# Patient Record
Sex: Male | Born: 1955 | Race: White | Hispanic: No | Marital: Married | Smoking: Former smoker
Health system: Southern US, Community
[De-identification: ages and names within clinical notes are randomized; demographics above are authoritative.]

## PROBLEM LIST (undated history)

## (undated) DIAGNOSIS — E291 Testicular hypofunction: Secondary | ICD-10-CM

## (undated) DIAGNOSIS — E039 Hypothyroidism, unspecified: Secondary | ICD-10-CM

## (undated) DIAGNOSIS — I10 Essential (primary) hypertension: Secondary | ICD-10-CM

## (undated) DIAGNOSIS — E785 Hyperlipidemia, unspecified: Secondary | ICD-10-CM

## (undated) DIAGNOSIS — E559 Vitamin D deficiency, unspecified: Secondary | ICD-10-CM

## (undated) DIAGNOSIS — M109 Gout, unspecified: Secondary | ICD-10-CM

## (undated) HISTORY — DX: Vitamin D deficiency, unspecified: E55.9

## (undated) HISTORY — DX: Essential (primary) hypertension: I10

## (undated) HISTORY — DX: Testicular hypofunction: E29.1

## (undated) HISTORY — DX: Gout, unspecified: M10.9

## (undated) HISTORY — DX: Hyperlipidemia, unspecified: E78.5

## (undated) HISTORY — DX: Hypothyroidism, unspecified: E03.9

---

## 1993-06-18 DIAGNOSIS — I10 Essential (primary) hypertension: Secondary | ICD-10-CM

## 1993-06-18 HISTORY — DX: Essential (primary) hypertension: I10

## 1997-10-08 ENCOUNTER — Ambulatory Visit (HOSPITAL_COMMUNITY): Admission: RE | Admit: 1997-10-08 | Discharge: 1997-10-08 | Payer: Self-pay | Admitting: Cardiology

## 1999-05-22 ENCOUNTER — Encounter: Payer: Self-pay | Admitting: Orthopedic Surgery

## 1999-05-22 ENCOUNTER — Encounter: Admission: RE | Admit: 1999-05-22 | Discharge: 1999-05-22 | Payer: Self-pay | Admitting: Orthopedic Surgery

## 2000-12-27 ENCOUNTER — Ambulatory Visit (HOSPITAL_COMMUNITY): Admission: RE | Admit: 2000-12-27 | Discharge: 2000-12-27 | Payer: Self-pay | Admitting: Family Medicine

## 2000-12-27 ENCOUNTER — Encounter: Payer: Self-pay | Admitting: Family Medicine

## 2003-12-10 ENCOUNTER — Ambulatory Visit (HOSPITAL_COMMUNITY): Admission: RE | Admit: 2003-12-10 | Discharge: 2003-12-10 | Payer: Self-pay | Admitting: Cardiology

## 2007-02-21 ENCOUNTER — Ambulatory Visit: Payer: Self-pay | Admitting: Internal Medicine

## 2007-02-25 ENCOUNTER — Ambulatory Visit: Payer: Self-pay | Admitting: Cardiology

## 2007-03-07 ENCOUNTER — Ambulatory Visit: Payer: Self-pay | Admitting: Pulmonary Disease

## 2007-03-11 ENCOUNTER — Ambulatory Visit: Payer: Self-pay | Admitting: Internal Medicine

## 2007-03-11 LAB — CONVERTED CEMR LAB
Albumin: 4.1 g/dL (ref 3.5–5.2)
BUN: 13 mg/dL (ref 6–23)
Basophils Relative: 3.8 % — ABNORMAL HIGH (ref 0.0–1.0)
CO2: 30 meq/L (ref 19–32)
Calcium: 9.5 mg/dL (ref 8.4–10.5)
Creatinine, Ser: 1.1 mg/dL (ref 0.4–1.5)
Eosinophils Relative: 0.2 % (ref 0.0–5.0)
GFR calc non Af Amer: 74 mL/min
Hemoglobin: 15.9 g/dL (ref 13.0–17.0)
Lymphocytes Relative: 8.9 % — ABNORMAL LOW (ref 12.0–46.0)
MCV: 85.7 fL (ref 78.0–100.0)
Monocytes Absolute: 0.7 10*3/uL (ref 0.2–0.7)
Monocytes Relative: 6.7 % (ref 3.0–11.0)
Neutro Abs: 8.4 10*3/uL — ABNORMAL HIGH (ref 1.4–7.7)

## 2007-03-13 ENCOUNTER — Ambulatory Visit: Admission: RE | Admit: 2007-03-13 | Discharge: 2007-03-13 | Payer: Self-pay | Admitting: Internal Medicine

## 2007-03-13 ENCOUNTER — Encounter: Payer: Self-pay | Admitting: Internal Medicine

## 2007-04-11 ENCOUNTER — Ambulatory Visit: Payer: Self-pay | Admitting: Cardiology

## 2008-06-18 DIAGNOSIS — E785 Hyperlipidemia, unspecified: Secondary | ICD-10-CM

## 2008-06-18 HISTORY — DX: Hyperlipidemia, unspecified: E78.5

## 2008-06-18 HISTORY — PX: BRONCHIAL BIOPSY: SHX5109

## 2008-06-30 ENCOUNTER — Encounter (INDEPENDENT_AMBULATORY_CARE_PROVIDER_SITE_OTHER): Payer: Self-pay | Admitting: *Deleted

## 2008-07-09 ENCOUNTER — Ambulatory Visit: Payer: Self-pay | Admitting: Internal Medicine

## 2008-07-09 DIAGNOSIS — E782 Mixed hyperlipidemia: Secondary | ICD-10-CM

## 2008-07-09 DIAGNOSIS — D869 Sarcoidosis, unspecified: Secondary | ICD-10-CM

## 2008-07-09 DIAGNOSIS — I10 Essential (primary) hypertension: Secondary | ICD-10-CM | POA: Insufficient documentation

## 2008-07-13 ENCOUNTER — Telehealth (INDEPENDENT_AMBULATORY_CARE_PROVIDER_SITE_OTHER): Payer: Self-pay | Admitting: *Deleted

## 2009-01-12 IMAGING — CR DG CHEST 2V
2 series · 2 of 2 positions shown · non-contrast
Comparison: None

CLINICAL DATA: Cough

CHEST - 2 VIEW:

[view not recorded (1 of 2)]
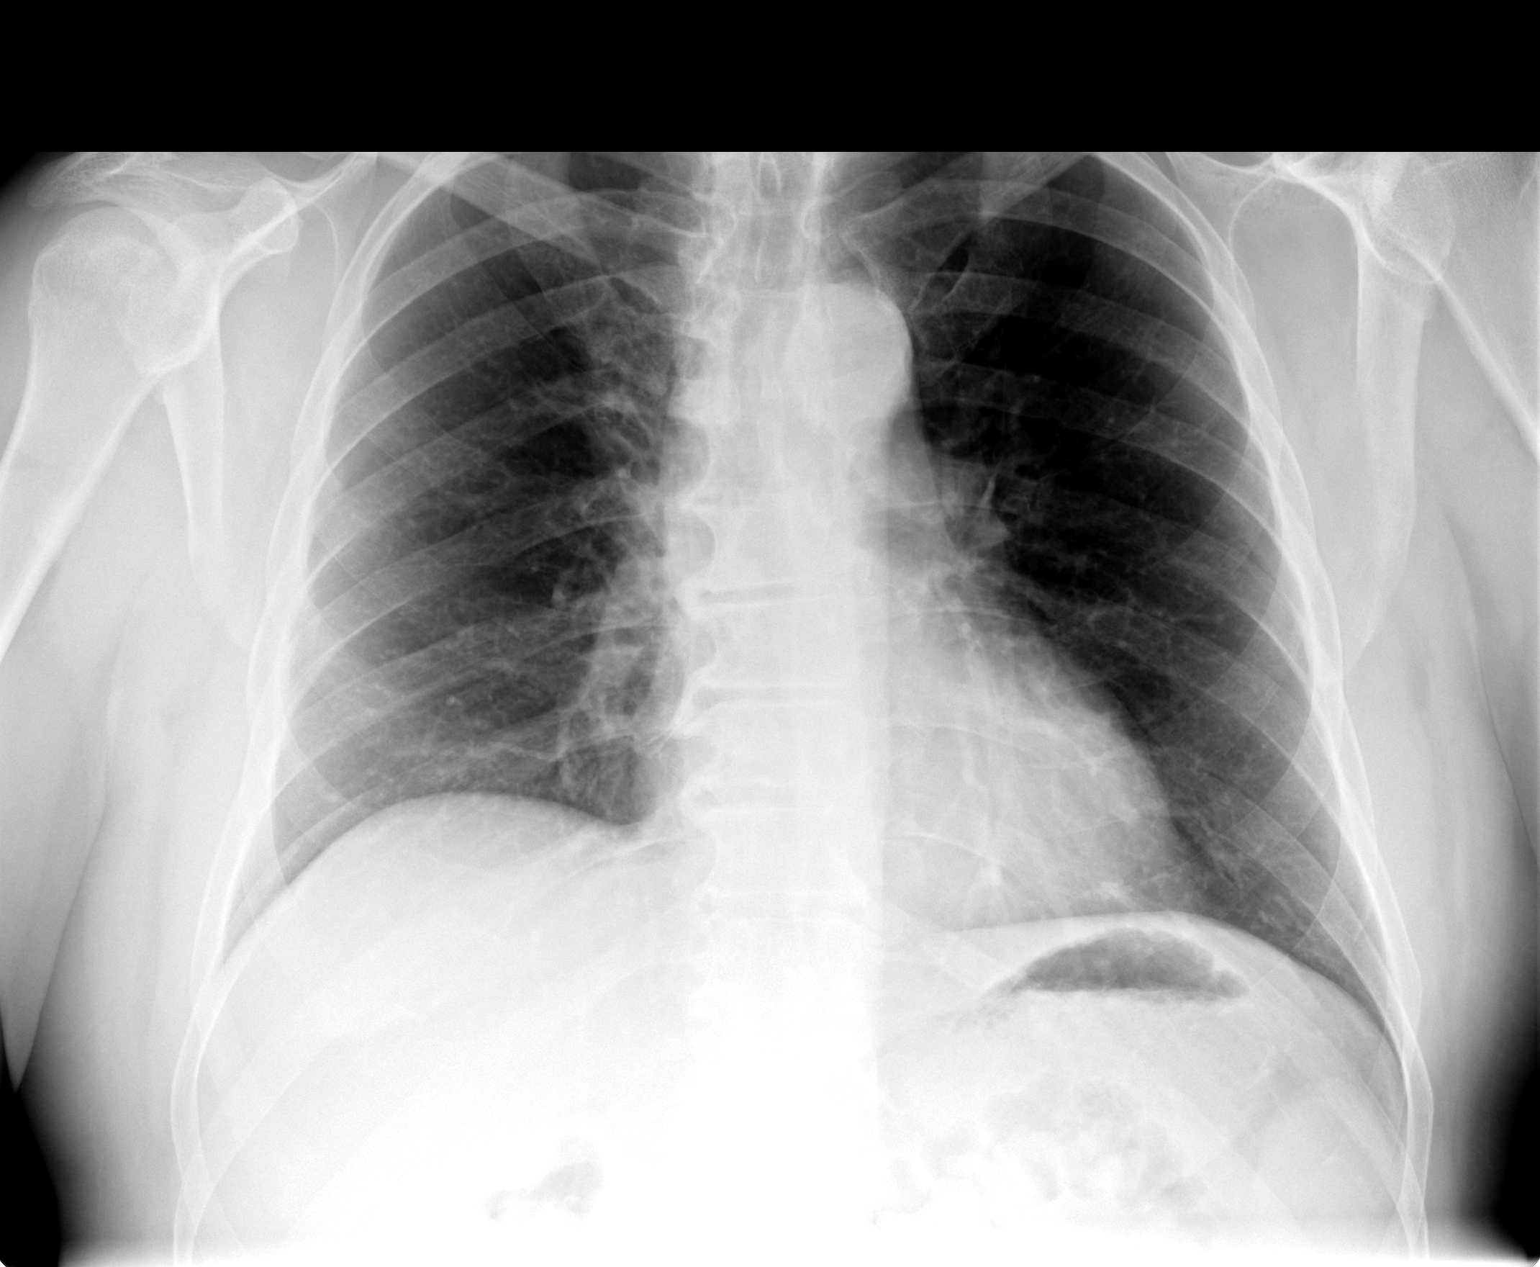

[view not recorded (2 of 2)]
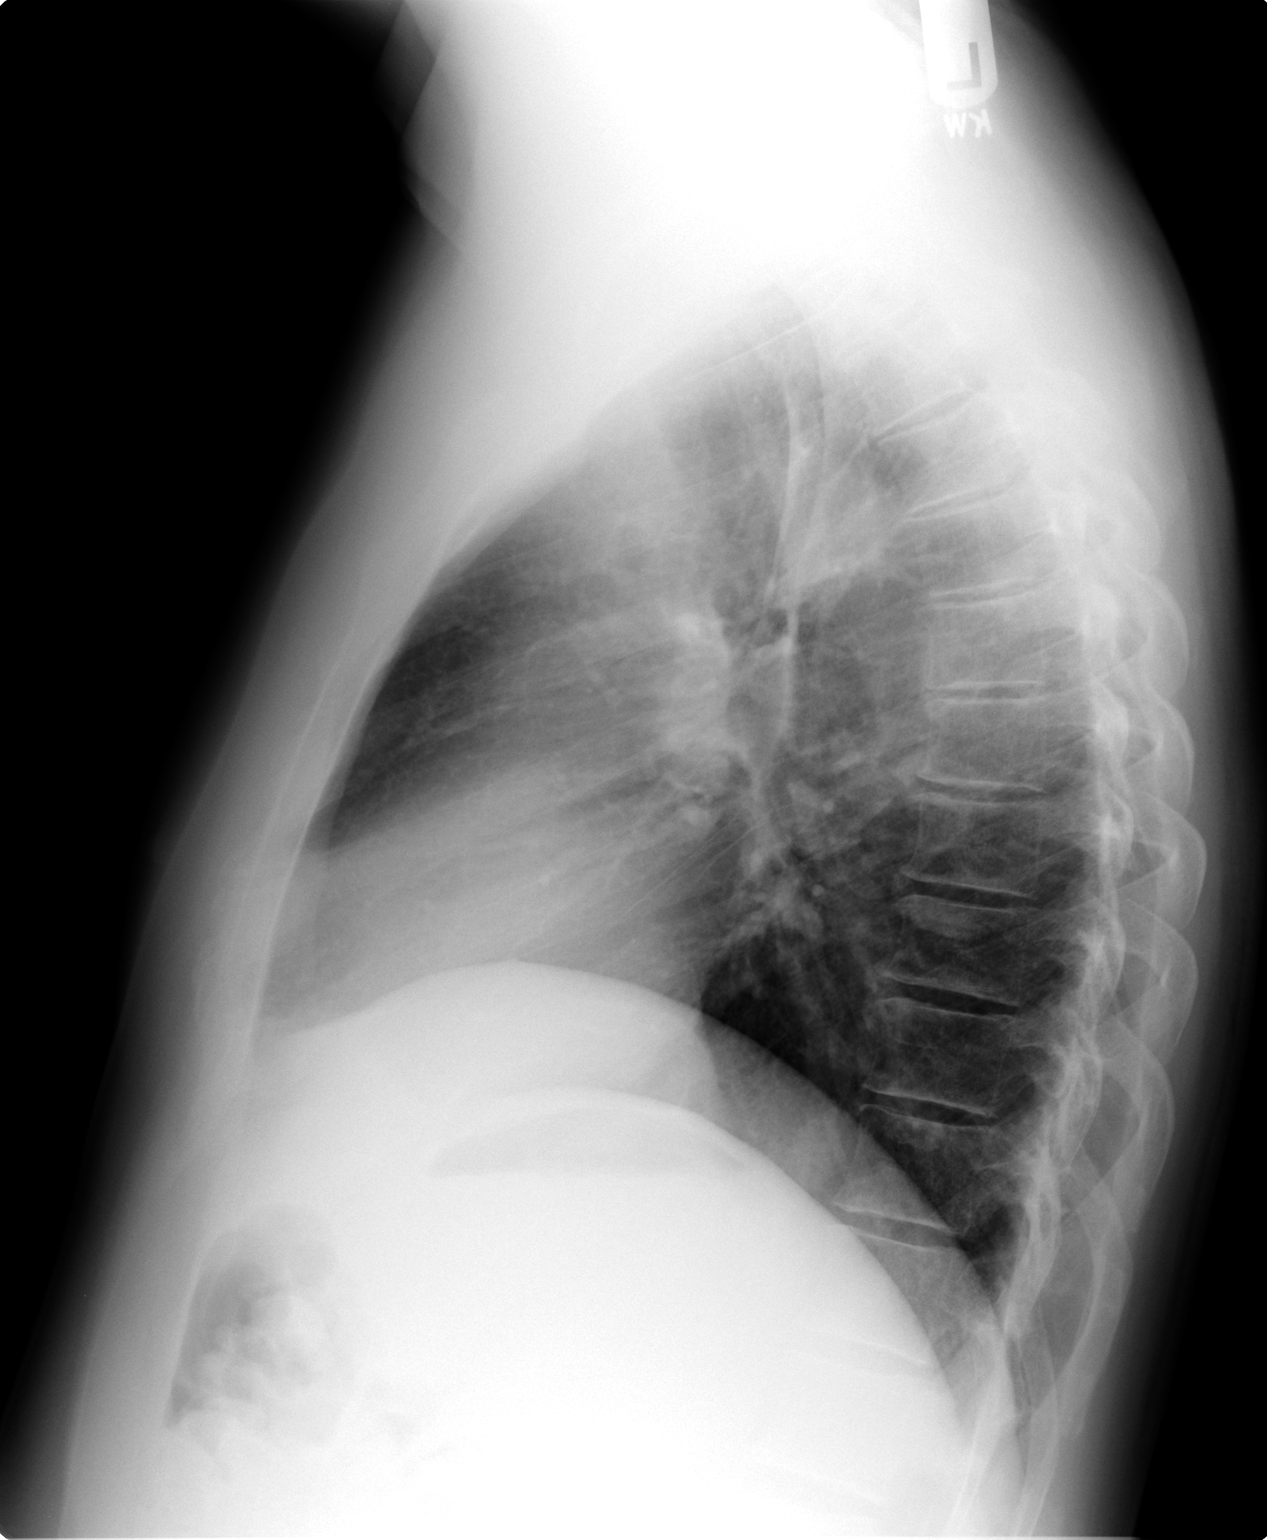

[2 of 2 positions shown; findings below may reference images not displayed]

FINDINGS: On the lateral view, there is triangular increased density noted
projecting of the upper lobes. This may correspond to slight increased density
in the right suprahilar region. Question area of pneumonia. Mild peribronchial
thickening. No focal opacities in the lower lungs. No effusions. Heart is normal
size.
IMPRESSION: Question consolidation in the right suprahilar region, best seen on the lateral
view. Recommend followup to resolution to exclude underlying lesion.

## 2009-01-16 IMAGING — CT CT CHEST W/ CM
2 of 4 series · 15 of 36 positions shown, 18 images · IV contrast (Omnipaque 300)
Comparison: none

HISTORY: Pneumonia on x-ray

Exam: CT CHEST WITH CONTRAST:
Axial CT images of the thorax from the thoracic inlet through the mid kidneys
was obtained after administration of IV contrast.
80 cc Omni 300 was administered IV.

[Series 2: chest_routine 5.0 b40f st · axial · 0.79mm/px · z∈[-260,-25]mm · 12 of 57 slices shown, 15 images]
[im 5/57  mediastinal]
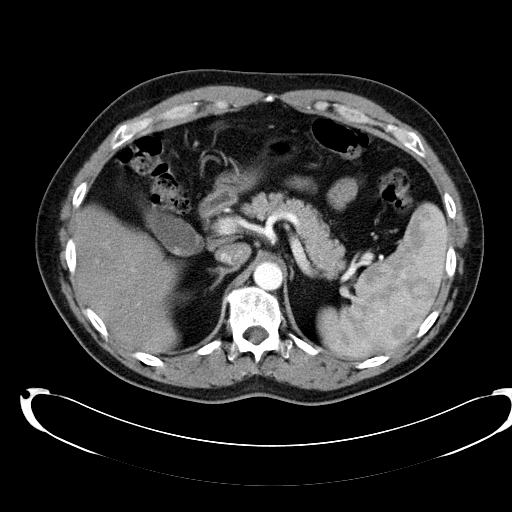
[im 5/57  lung]
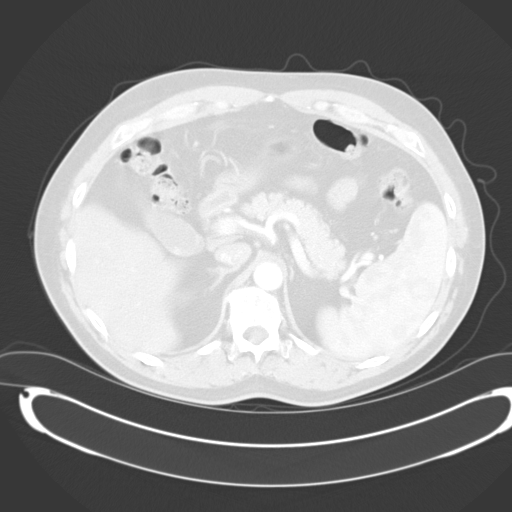
[im 9/57  lung]
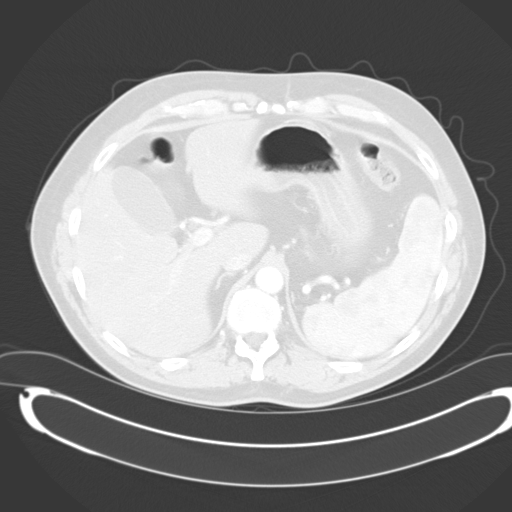
[im 13/57  lung]
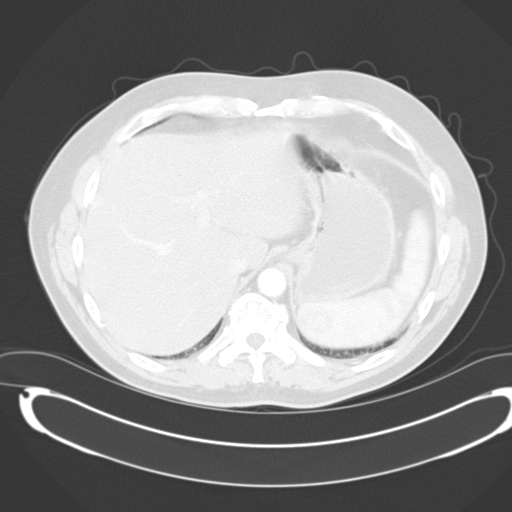
[im 18/57  lung]
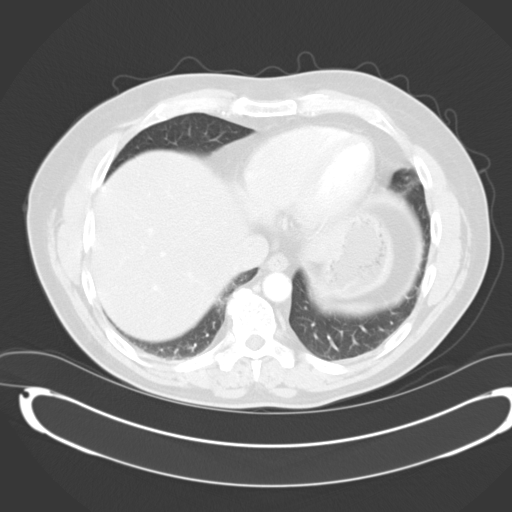
[im 22/57  mediastinal]
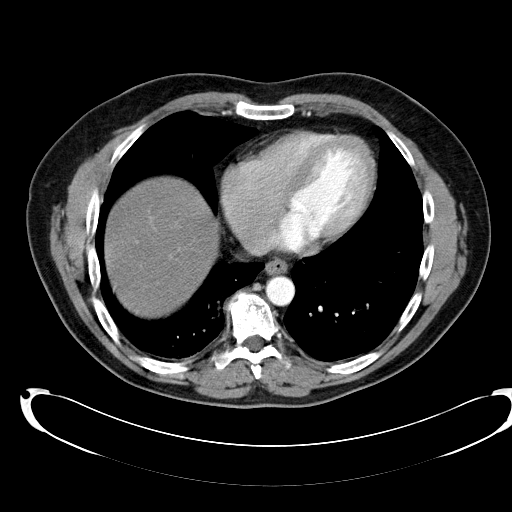
[im 22/57  lung]
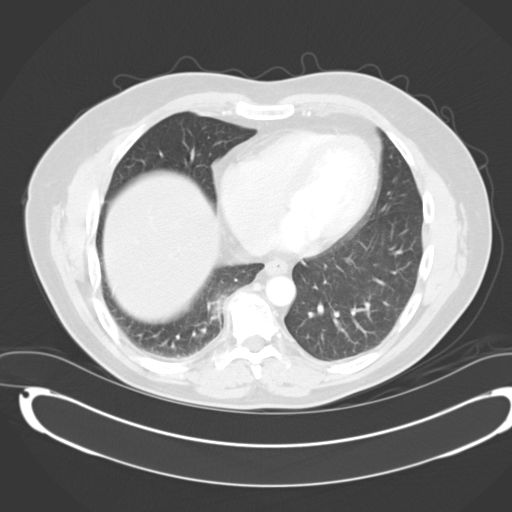
[im 26/57  lung]
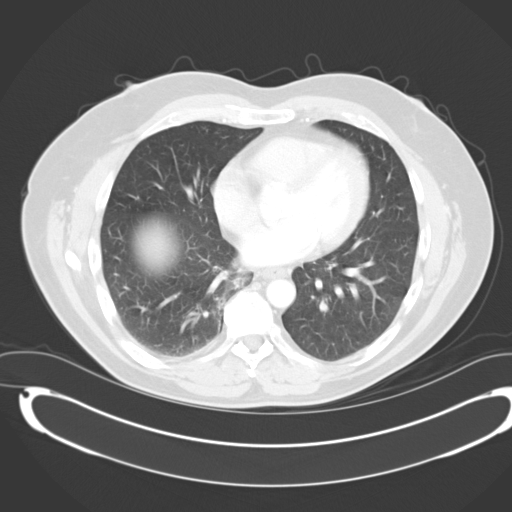
[im 31/57  lung]
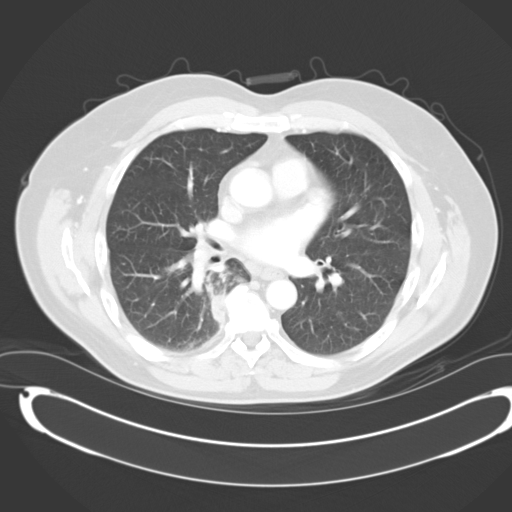
[im 35/57  lung]
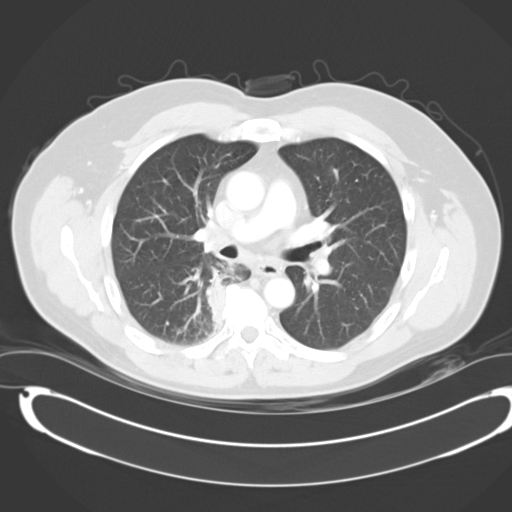
[im 39/57  mediastinal]
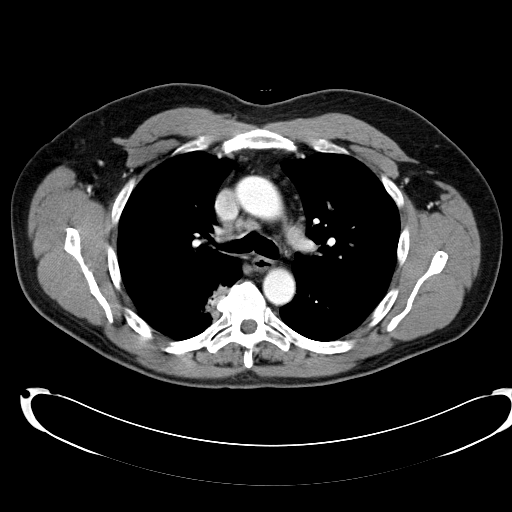
[im 39/57  lung]
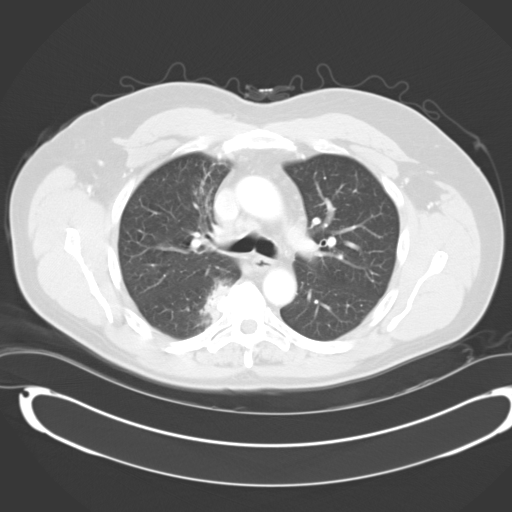
[im 44/57  lung]
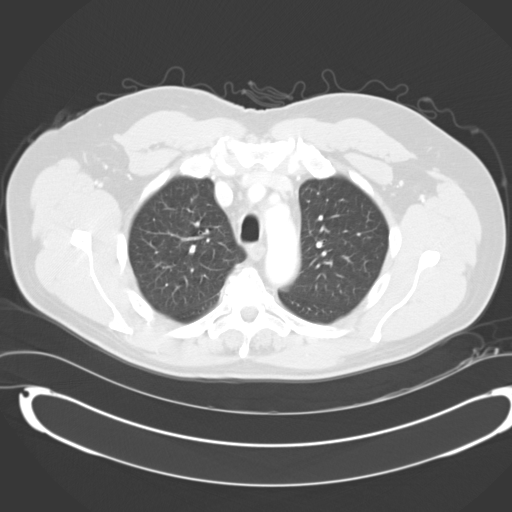
[im 48/57  lung]
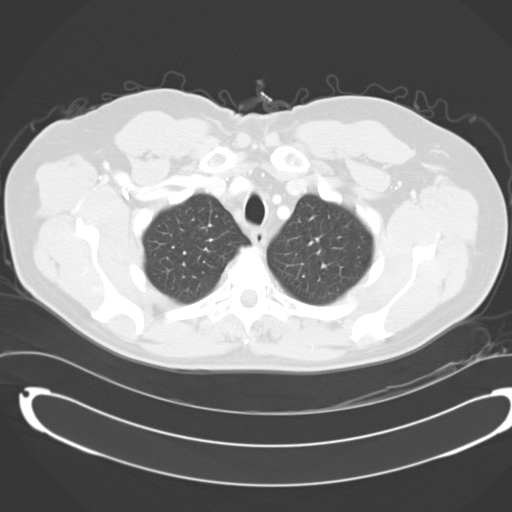
[im 52/57  lung]
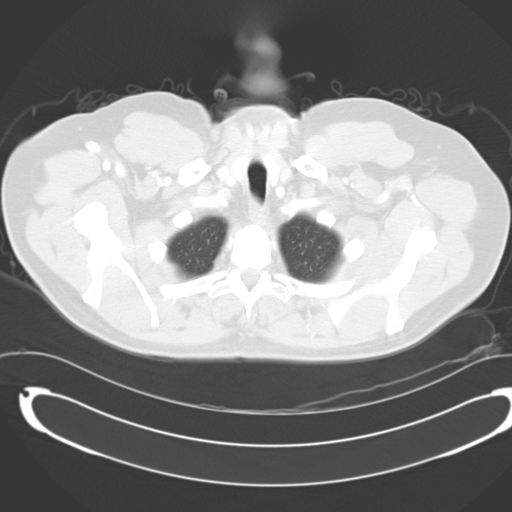

[Series 602: <mpr range> · coronal · 0.79mm/px · 3 of 118 slices shown]
[im 24/118  lung]
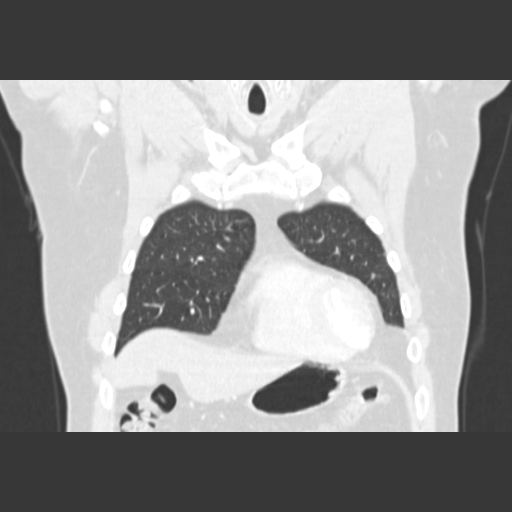
[im 47/118  lung]
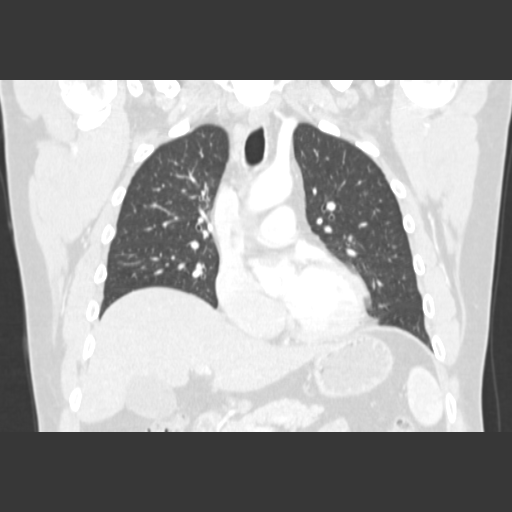
[im 71/118  lung]
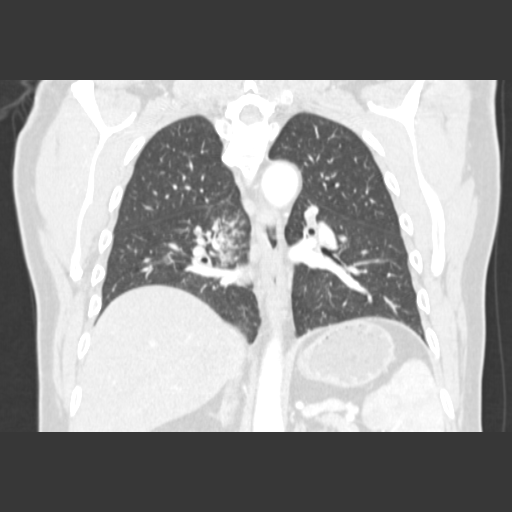

[15 of 36 positions shown; findings below may reference images not displayed]

FINDINGS: There is a linear focus of consolidation within the superior segment of the
right lower lobe in a  paraspinal location measuring 32 x 12 mm (image 21).
There is an air bronchogram in  the peripheral aspect of this bandlike
consolidation.  The mass of bandlike process extends into the azygoesophageal
recess with a small 9 mm nodule on image 31.  No additional suspicious pulmonary
nodules evident within the right or left lungs. Within the mediastinum, there
are multiple subcentimeter lymph nodes in the right upper peritracheal, right
lower paratracheal, precarinal, subcarinal positions. No evidence of axillary
adenopathy. Limited view of the upper abdomen demonstrates dependent gallstones
in the gallbladder. Otherwise unremarkable.
IMPRESSION: 1. Bandlike consolidation with mild nodularity within superior segment of the
right lower lobe is most consistent with pneumonia and/or atelectasis. Recommend
followup to resolution with noncontrast CT  in 8 to 12 weeks.
2. Multiple paratracheal and carinal lymph nodes are no pathologic by size
criteria and likely represent reactive adenopathy.
3. Cholelithiasis.

## 2009-09-17 ENCOUNTER — Inpatient Hospital Stay (HOSPITAL_COMMUNITY): Admission: EM | Admit: 2009-09-17 | Discharge: 2009-09-21 | Payer: Self-pay | Admitting: Pulmonary Disease

## 2009-09-17 ENCOUNTER — Ambulatory Visit: Payer: Self-pay | Admitting: Internal Medicine

## 2009-09-17 ENCOUNTER — Encounter: Payer: Self-pay | Admitting: Emergency Medicine

## 2009-09-17 ENCOUNTER — Ambulatory Visit: Payer: Self-pay | Admitting: Radiology

## 2009-09-19 ENCOUNTER — Encounter (INDEPENDENT_AMBULATORY_CARE_PROVIDER_SITE_OTHER): Payer: Self-pay | Admitting: Pulmonary Disease

## 2009-09-30 ENCOUNTER — Ambulatory Visit: Payer: Self-pay | Admitting: Internal Medicine

## 2009-09-30 DIAGNOSIS — J1520 Pneumonia due to staphylococcus, unspecified: Secondary | ICD-10-CM

## 2009-11-25 ENCOUNTER — Ambulatory Visit: Payer: Self-pay | Admitting: Cardiovascular Disease

## 2009-12-07 ENCOUNTER — Telehealth (INDEPENDENT_AMBULATORY_CARE_PROVIDER_SITE_OTHER): Payer: Self-pay | Admitting: *Deleted

## 2010-06-18 DIAGNOSIS — E039 Hypothyroidism, unspecified: Secondary | ICD-10-CM

## 2010-06-18 HISTORY — DX: Hypothyroidism, unspecified: E03.9

## 2010-07-18 NOTE — Assessment & Plan Note (Signed)
Summary: PNA/ HEMOPTYSIS PER PETE///kp   Visit Type:  Follow-up Primary Provider/Referring Provider:  Dr. Earney Navy, Ginette Otto  CC:  HFU. Pt denies hemoptysis. He does still have productive cough with clear phlegm and and c/o phlegm in back of throat. James Contreras  History of Present Illness: OV 09/30/2009: Followup from recent hospitalization 09/17/2009 - 09/21/2009 for Lingular Infilrates and submassive hemoptysis. Etiology felt to be MRSA PNA. Discharged on 14d clindamycin. Now presenting for followup. OVerall better. No further hemoptysis. REsidual cough present.  Been having some NEW  mild-moderate severity diarrhea ever since he went on clindamycin but occ. low grade fever 99.78F sometimes. Overall course is stable.  Diarrhea not foul smelling. No associated abdominal cramps or blood in stool. Appetitie good. No vomitting or nausea or rash. Has only 2 more days of clindamycin left to finish 14d course  Preventive Screening-Counseling & Management  Alcohol-Tobacco     Smoking Status: quit  Current Medications (verified): 1)  Toprol Xl 50 Mg  Tb24 (Metoprolol Succinate) .James Contreras.. 1 By Mouth Once Daily 2)  Adult Aspirin Low Strength 81 Mg  Tbdp (Aspirin) .... Take 1 Tablet By Mouth Once A Day 3)  B12 Tablets 4)  Clindamycin Hcl 300 Mg Caps (Clindamycin Hcl) .... 1.5 Tabs Every 8 Hours 5)  Pravachol 40 Mg Tabs (Pravastatin Sodium) .... Pt Has Not Taken Since Discharge 6)  Synthroid 75 Mcg Tabs (Levothyroxine Sodium) .... Has Not Taken Since Discharge  Allergies (verified): No Known Drug Allergies  Past History:  Family History: Last updated: 03-Aug-2008 CAD - F deceased age 3 MI; PGM deceased age 29 MI HTN - no DM - no colon Ca - no prostate Ca - no stroke - no Breast Ca - M  Social History: Last updated: 09/30/2009 Married 2 children, 1 g-baby Occupation: metal work Patient states former smoker. Quit 1990's, smoked 43yrs avg 2 ppd.  Risk Factors: Smoking Status: quit  (09/30/2009)  Past Medical History: Reviewed history from August 03, 2008 and no changes required. 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 arrhytmia?) Hyperlipidemia Dx 2009, offered meds   Past Surgical History: Reviewed history from August 03, 2008 and no changes required. no major surgery  Family History: Reviewed history from August 03, 2008 and no changes required. CAD - F deceased age 32 MI; PGM deceased age 74 MI HTN - no DM - no colon Ca - no prostate Ca - no stroke - no Breast Ca - M  Social History: Reviewed history from Aug 03, 2008 and no changes required. 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 Quit 1990's, smoked 49yrs avg 2 ppd. Smoking Status:  quit  Review of Systems       The patient complains of productive cough.  The patient denies shortness of breath with activity, shortness of breath at rest, non-productive cough, coughing up blood, chest pain, irregular heartbeats, acid heartburn, indigestion, loss of appetite, weight change, abdominal pain, difficulty swallowing, sore throat, tooth/dental problems, headaches, nasal congestion/difficulty breathing through nose, sneezing, itching, ear ache, anxiety, depression, hand/feet swelling, joint stiffness or pain, rash, change in color of mucus, and fever.    Vital Signs:  Patient profile:   55 year old male Height:      71 inches Weight:      210 pounds BMI:     29.39 O2 Sat:      96 % on Room  air Temp:     97.9 degrees F oral Pulse rate:   76 / minute BP sitting:   136 / 86  (right arm) Cuff size:   regular  Vitals Entered By: Carron Curie CMA (September 30, 2009 9:42 AM)  O2 Flow:  Room air CC: HFU. Pt denies hemoptysis. He does still have productive cough with clear phlegm, and c/o phlegm in back of throat.  Comments  Medications reviewed with patient Carron Curie CMA  September 30, 2009 9:49 AM Daytime phone number verified with patient.    Physical Exam  General:  alert and well-developed.  well developed, well nourished, in no acute distress Head:  normocephalic and atraumatic Eyes:  PERRLA/EOM intact; conjunctiva and sclera clear Ears:  TMs intact and clear with normal canals Nose:  no deformity, discharge, inflammation, or lesions Mouth:  no deformity or lesions Neck:  no masses.  no masses, thyromegaly, or abnormal cervical nodes Chest Wall:  no deformities noted Lungs:  normal respiratory effort, no intercostal retractions, no accessory muscle use, and normal breath sounds.  clear bilaterally to auscultation and percussion Heart:  normal rate, regular rhythm, and no murmur.  regular rate and rhythm, S1, S2 without murmurs, rubs, gallops, or clicks Abdomen:  bowel sounds positive; abdomen soft and non-tender without masses, or organomegaly Msk:  no deformity or scoliosis noted with normal posture Pulses:  pulses normal Extremities:  no pretibial edema bilaterally no clubbing, cyanosis, edema, or deformity noted Neurologic:  CN II-XII grossly intact with normal reflexes, coordination, muscle strength and tone Skin:  intact without lesions or rashes Cervical Nodes:  no significant adenopathy Axillary Nodes:  no significant adenopathy Psych:  Cognition and judgment appear intact. Alert and cooperative with normal attention span and concentration alert and cooperative; normal mood and affect; normal attention span and concentration   Impression & Recommendations:  Problem # 1:  DIARRHEA (ICD-787.91) Assessment New  Doubt C Diff. Suspect reaction to Clin[facility]. Still needs close monitoring.   PLAN finish your antibiotics this sunday as scheduled monitor your diarrhea  > if it persists after you stop antibiotics call us  > if you have feve over 127f call us  > if diarrhea changes for the  worse call us  Orders: Est. Patient Level IV (73710)  Problem # 2:  HEMOPTYSIS UNSPECIFIED (ICD-786.30) Assessment: Improved  resolved.  plan will repeat ct in 8 weeks His updated medication list for this problem includes:    Clindamycin Hcl 300 Mg Caps (Clindamycin hcl) .James Contreras... 1.5 tabs every 8 hours  Orders: Est. Patient Level IV (62694)  Problem # 3:  MRSA PNEUMONIA (ICD-482.40) Assessment: Improved  finish 14 day clinda in 2 days His updated medication list for this problem includes:    Clindamycin Hcl 300 Mg Caps (Clindamycin hcl) .James Contreras... 1.5 tabs every 8 hours  Orders: Est. Patient Level IV (85462)  Medications Added to Medication List This Visit: 1)  Clindamycin Hcl 300 Mg Caps (Clindamycin hcl) .... 1.5 tabs every 8 hours 2)  Pravachol 40 Mg Tabs (Pravastatin sodium) .... Pt has not taken since discharge 3)  Synthroid 75 Mcg Tabs (Levothyroxine sodium) .... Has not taken since discharge  Other Orders: Radiology Referral (Radiology)  Patient Instructions: 1)  finish your antibiotics this sunday as scheduled 2)  monitor your diarrhea 3)   > if it persists after you stop antibiotics call us 4)   > if you have feve over 127f call us 5)   > if diarrhea changes for the  worse call us 6)  call us or go to ER if there is return of coughing blood 7)  return in 8 weeks with CT chest or sooner if any concern

## 2010-07-18 NOTE — Progress Notes (Signed)
Summary: returning call  Phone Note Call from Patient Call back at Home Phone [phone]   Caller: Patient Call For: ramaswamy Summary of Call: Returning Crystal's call. Initial call taken by: Darletta Moll,  December 07, 2009 9:38 AM  Follow-up for Phone Call        Spoke with pt and advised that per MR append CT was completely clear, just needs to sched rov with him for followup.  Pt states that he is getting ready for his vacation and will need to call us back to set up appt.   Follow-up by: Vernie Murders,  December 07, 2009 9:51 AM

## 2010-09-06 LAB — COMPREHENSIVE METABOLIC PANEL
ALT: 29 U/L (ref 0–53)
AST: 20 U/L (ref 0–37)
Alkaline Phosphatase: 48 U/L (ref 39–117)
CO2: 26 mEq/L (ref 19–32)
Calcium: 8.7 mg/dL (ref 8.4–10.5)
GFR calc Af Amer: >60 mL/min (ref >60)
GFR calc non Af Amer: 60 mL/min — ABNORMAL LOW (ref >60)
Glucose, Bld: 135 mg/dL — ABNORMAL HIGH (ref 70–99)
Potassium: 3.6 mEq/L (ref 3.5–5.1)
Sodium: 137 mEq/L (ref 135–145)

## 2010-09-06 LAB — CBC
HCT: 47.6 % (ref 39.0–52.0)
HCT: 49.7 % (ref 39.0–52.0)
Hemoglobin: 15.9 g/dL (ref 13.0–17.0)
Hemoglobin: 16.1 g/dL (ref 13.0–17.0)
Hemoglobin: 16.8 g/dL (ref 13.0–17.0)
MCHC: 33.5 g/dL (ref 30.0–36.0)
MCHC: 33.6 g/dL (ref 30.0–36.0)
MCHC: 33.7 g/dL (ref 30.0–36.0)
MCV: 87.1 fL (ref 78.0–100.0)
MCV: 87.5 fL (ref 78.0–100.0)
MCV: 87.8 fL (ref 78.0–100.0)
Platelets: 153 10*3/uL (ref 150–400)
Platelets: 164 10*3/uL (ref 150–400)
RBC: 5.4 MIL/uL (ref 4.22–5.81)
RBC: 5.52 MIL/uL (ref 4.22–5.81)
RBC: 5.76 MIL/uL (ref 4.22–5.81)
RDW: 13.5 % (ref 11.5–15.5)
WBC: 6.4 10*3/uL (ref 4.0–10.5)
WBC: 8.3 10*3/uL (ref 4.0–10.5)
WBC: 8.4 10*3/uL (ref 4.0–10.5)
WBC: 9 10*3/uL (ref 4.0–10.5)

## 2010-09-06 LAB — BODY FLUID CELL COUNT WITH DIFFERENTIAL
Eos, Fluid: 3 %
Monocyte-Macrophage-Serous Fluid: 2 % — ABNORMAL LOW (ref 50–90)
Other Cells, Fluid: 0 %

## 2010-09-06 LAB — APTT: aPTT: 32 seconds (ref 24–37)

## 2010-09-06 LAB — BASIC METABOLIC PANEL
BUN: 11 mg/dL (ref 6–23)
BUN: 6 mg/dL (ref 6–23)
BUN: 8 mg/dL (ref 6–23)
CO2: 24 mEq/L (ref 19–32)
CO2: 28 mEq/L (ref 19–32)
CO2: 28 mEq/L (ref 19–32)
Calcium: 8.5 mg/dL (ref 8.4–10.5)
Chloride: 105 mEq/L (ref 96–112)
Chloride: 105 mEq/L (ref 96–112)
Chloride: 105 mEq/L (ref 96–112)
Creatinine, Ser: 1.25 mg/dL (ref 0.4–1.5)
Creatinine, Ser: 1.3 mg/dL (ref 0.4–1.5)
GFR calc Af Amer: >60 mL/min (ref >60)
GFR calc Af Amer: >60 mL/min (ref >60)
GFR calc Af Amer: >60 mL/min (ref >60)
GFR calc non Af Amer: 56 mL/min — ABNORMAL LOW (ref >60)
Glucose, Bld: 110 mg/dL — ABNORMAL HIGH (ref 70–99)
Potassium: 4.1 mEq/L (ref 3.5–5.1)
Sodium: 138 mEq/L (ref 135–145)

## 2010-09-06 LAB — CULTURE, BLOOD (ROUTINE X 2): Culture: NO GROWTH

## 2010-09-06 LAB — URINALYSIS, ROUTINE W REFLEX MICROSCOPIC
Bilirubin Urine: NEGATIVE
Glucose, UA: NEGATIVE mg/dL
Hgb urine dipstick: NEGATIVE
Ketones, ur: NEGATIVE mg/dL
Protein, ur: NEGATIVE mg/dL
pH: 5.5 (ref 5.0–8.0)

## 2010-09-06 LAB — ANGIOTENSIN CONVERTING ENZYME: Angiotensin-Converting Enzyme: 47 U/L (ref 9–67)

## 2010-09-06 LAB — DIFFERENTIAL
Basophils Absolute: 0 10*3/uL (ref 0.0–0.1)
Basophils Absolute: 0 10*3/uL (ref 0.0–0.1)
Basophils Relative: 0 % (ref 0–1)
Basophils Relative: 1 % (ref 0–1)
Eosinophils Absolute: 0.3 10*3/uL (ref 0.0–0.7)
Eosinophils Absolute: 0.4 10*3/uL (ref 0.0–0.7)
Eosinophils Relative: 4 % (ref 0–5)
Eosinophils Relative: 6 % — ABNORMAL HIGH (ref 0–5)
Lymphocytes Relative: 13 % (ref 12–46)
Lymphocytes Relative: 22 % (ref 12–46)
Lymphs Abs: 1.1 10*3/uL (ref 0.7–4.0)
Lymphs Abs: 1.8 10*3/uL (ref 0.7–4.0)
Monocytes Absolute: 0.5 10*3/uL (ref 0.1–1.0)
Monocytes Absolute: 0.8 10*3/uL (ref 0.1–1.0)
Monocytes Relative: 8 % (ref 3–12)
Neutro Abs: 4.6 10*3/uL (ref 1.7–7.7)
Neutro Abs: 6.4 10*3/uL (ref 1.7–7.7)
Neutrophils Relative %: 71 % (ref 43–77)

## 2010-09-06 LAB — ANA: Anti Nuclear Antibody(ANA): NEGATIVE

## 2010-09-06 LAB — MAGNESIUM
Magnesium: 2.1 mg/dL (ref 1.5–2.5)
Magnesium: 2.2 mg/dL (ref 1.5–2.5)

## 2010-09-06 LAB — AFB CULTURE WITH SMEAR (NOT AT ARMC): Acid Fast Smear: NONE SEEN

## 2010-09-06 LAB — FUNGUS CULTURE W SMEAR

## 2010-09-06 LAB — STREP PNEUMONIAE URINARY ANTIGEN: Strep Pneumo Urinary Antigen: NEGATIVE

## 2010-09-06 LAB — CULTURE, RESPIRATORY W GRAM STAIN: Culture: NORMAL

## 2010-09-06 LAB — LEGIONELLA ANTIGEN, URINE

## 2010-09-06 LAB — PHOSPHORUS: Phosphorus: 3.7 mg/dL (ref 2.3–4.6)

## 2010-09-06 LAB — LEGIONELLA PROFILE(CULTURE+DFA/SMEAR)

## 2010-09-06 LAB — TYPE AND SCREEN: Antibody Screen: NEGATIVE

## 2010-09-06 LAB — C-REACTIVE PROTEIN: CRP: 0.2 mg/dL — ABNORMAL LOW (ref <0.6)

## 2010-10-31 NOTE — Assessment & Plan Note (Signed)
[facility]                             PULMONARY OFFICE NOTE   NAME:POEGergory, Biello                           MRN:          [id number]  DATE:02/21/2007                            DOB:          [birth year]    CHIEF COMPLAINT:  Cough, previous diagnosis of sarcoidosis. He wants to  know if this is sarcoidosis again.   HISTORY OF PRESENT ILLNESS:  This is a very pleasant 55 year old  gentleman who has been coughing for the last 2 weeks. He says that  around two weeks ago, he abruptly developed a cough and then five days  ago it was at its worst, and at that time he also developed a fever, but  now for the last one day, the fever has gone, and his cough he feels is  breaking up and he is bringing up some sputum which is dark green  around 2 to 3 times per day. It is very little sputum each time.   Overall, the cough is present day and night and it is making him lose  sleep. There are no aggravating or relieving factors. He says that the  cough is very similar to the time that he had sarcoidosis. He and his  wife are extremely concerned that his sarcoidosis has recurred and that  is why he is here. He used to be followed by Dr. Jetty Duhamel in the  1980s, but since it has been so long, he has now established care with  me, because Dr. Maple Hudson is booked out for many months.   PAST MEDICAL HISTORY:  1. Diagnosis of sarcoidosis in 1983. At that time, he presented with a      50 pound weight loss. He was diagnosed by Dr. Maple Hudson. From what I      can gather from Mr. Koeppen, he had a lumbar puncture that was negative      at this time. He presented with severe cough and had to quit      smoking. He did take prednisone. He is unclear to what extent his      pulmonary sarcoidosis was, but it seems like it was only stage 1      because he says that he only had lymph node enlargement without any      lung involvement. He was followed for a few months and then after  that there was no more follow up. He said that he took prednisone      for a while, but he did not really like it.  2. Hypertension.  3. Heart rhythm problems.  4. Hyperlipidemia.   PAST SURGICAL HISTORY:  None.   ALLERGIES:  No known drug allergies.   CURRENT MEDICATIONS:  1. Toprol.  2. Aspirin.   SOCIAL HISTORY:  He smoked two packs a day for 10 years, but quit in  1983 at the time of sarcoid diagnosis. He is married and lives with his  children. He drinks two beers a day. He works as a Civil [employer] fast streamer.  He has never worked in Advertising account planner. His  current employer is  Development worker, international aid.   FAMILY HISTORY:  His paternal uncle had emphysema. His father and  paternal grandmother had heart disease. His mother had breast cancer. A  maternal aunt had breast cancer, and a paternal uncle had lung cancer.   REVIEW OF SYSTEMS:  This is documented in detail in the questionnaire.  Significant in the review of systems is that ever since the diagnosis of  sarcoidosis in the 1980s, he has always had dyspnea during the  summertime. Other than that, the review of systems is positive for a six  pound weight loss recently, loss of appetite, some joint stiffness, and  current acute symptoms.   PHYSICAL EXAMINATION:  VITAL SIGNS:  Weight 194.2 pounds, temperature  97.7, blood pressure 144/92, pulse 73, pulse ox 93% on room air.  GENERAL:  Pleasant male seated comfortably in the exam room. He coughs a  lot.  CENTRAL NERVOUS SYSTEM:  Awake, alert, and oriented x3, speech and  ambulation are normal.  CARDIOVASCULAR:  Normal heart sounds. Regular rate and rhythm, no  murmurs, no gallops.  RESPIRATORY:  Air entry equal on both sides. There might be some  crackles on the right lower chest.  NECK:  Supple. No neck nodes. No elevated JVP.  ABDOMEN:  Soft and nontender, no organomegaly.  EXTREMITIES:  No cyanosis, clubbing, or pedal edema.  SKIN:  Intact.  MUSCULOSKELETAL:  Spine is  normal.   LABORATORY DATA:  Chest x-ray done on December 27, 2000 was reported as  negative. Cardiac myo perfusion scan done on January 09, 2004 was negative.   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 He had a  negative chest x-ray in 2002. Currently, he has acute bronchitis  symptoms. He is extremely worried that he has a recurrence of  sarcoidosis.   His physical exam is non-contributory.   Clinically, this sounds like acute bronchitis. I have give him a  prescription for moxifloxacin. I performed a chest x-ray at our clinic  today. On my evaluation, the chest x-ray looked normal, but I question a  suprahilar shadow which I was not fully convinced about. Before we could  get the official report, I informed the patient that this x-ray was  possibly normal, but I was waiting for the official report.   After the patient left the clinic, the radiologist did agree with my gut  impression of the suprahilar infiltrate. Therefore, our clinic secretary  will call Mr. Delucia and schedule him for a CT scan of the  chest. Nevertheless, he is to continue his antibiotics and finish this  seven day course of moxifloxacin. I do not know the cause of this  infiltrate. We will follow this up.     Kalman Shan, MD  Electronically Signed    MR/MedQ  DD: 02/23/2007  DT: 02/23/2007  Job #: [id number]

## 2010-10-31 NOTE — Assessment & Plan Note (Signed)
[facility]                             PULMONARY OFFICE NOTE   NAME:POESundeep, [name]                           MRN:          [id number]  DATE:03/11/2007                            DOB:          March 01, 1956    CHIEF COMPLAINT:  Persistent cough and new onset of back pain/lung pain.   HISTORY OF PRESENT ILLNESS:  [name] is a pleasant, 55 year old  male.  I last saw him in clinic on February 21, 2007 for new onset of  cough and green sputum.  At that time I discharged him from clinic with  prescription Avelox.  After discharge from the clinic, we got official  report that the chest x-ray showed right lower lobe superior segment  consolidation.  I performed a CT scan of the chest which showed a  paraspinal right upper lobe superior segment consolidation.  I then  called him and he said that his cough was still persistent.  I did not  have a clinic opening, so I hadhim see my colleague on an acute visit.  He met with Dr. Shelle Iron on September of 19, 2008.  At that time, his  vitals were stable.  He was afebrile, but he still had persistent cough;  and after reviewing his details, Dr. Shelle Iron, commenced him on Augmentin  extended release 1000 mg b.i.d. for 10 days, and also on Medrol Dosepak  (the patient had poor tolerance to prednisone with prior sarcoidosis).  He also was given albuterol MDI.   He now returns to see me, again, on an acute basis.  He states that even  after starting Augmentin he is not any better, even though his green  sputum is slightly less clear.  This past week he has developed for of  an upper back/lower chest pain on the right side and below the right  scapula.  This is present when he moves, coughs, or takes a deep breath;  and this morning was particularly bad.   He denies any fever, hemoptysis, leg swelling, aspiration, exposure to  dust, or any other symptoms.   PAST MEDICAL HISTORY:  Prior sarcoidosis in 1983, as noted in  evaluation  of February 21, 2007.  Otherwise no change.   ALLERGIES:  No change since February 21, 2007.   REVIEW OF SYSTEMS:  No change since February 21, 2007 other than the new  onset of back pain.   SOCIAL HISTORY:  No change since February 21, 2007.   FAMILY HISTORY:  No change since February 21, 2007.   PHYSICAL EXAM:  Weight today was 194.2 pounds.  Temperature 97.6, blood  pressure 124/88, pulse 87, pulse oximetry 95% on room air.  The major change in today's physical exam is extensive bilateral  wheezing and a possible friction rub in the right lower lobe,  infrascapular region.  CENTRAL NERVOUS SYSTEM:  Alert and oriented x3.  Speech and ambulation  are normal.  GENERAL EXAM:  He is coughing a lot in the exam room, but it is dry.  RESPIRATORY EXAM:  Air entry equal, bilateral wheezing  present. A  possible friction rub in the right lower lobe.  CARDIOVASCULAR:  Normal heart sounds, no murmurs.  ABDOMEN:  Soft, no organomegaly, no masses.  EXTREMITIES:  No edema, no clubbing, no cyanosis,  SKIN EXAM:  Intact.  HEENT:  No neck nodes, supple neck.   LABORATORY EVALUATION:  There is another chest x-ray today, and it shows  persistence of the right lower lobe consolidation well seen in the  lateral view.  I am not sure if it is slightly better than September 5,  but it is definitely there.  This was also confirmed by my colleague Dr.  Marcelyn Bruins. However, radiology reported it as normal chest x-ray   ASSESSMENT AND PLAN:  I am not sure anymore that we are dealing with  regular community-acquired pneumonia.  He is not any better.  I have  ordered some basic lab work, [employer]. and chem panel for him.  We will also  get PT/PTT.  I will plan to do a bronchoscopy on him in the next couple  of days to look for any foreign bodies, and send off lavage.   He has had questions about going to work.  He says that he gets dyspneic  when he works.  I have told him to take off from work  this past week in  order to cool things off.  PFTs show spirometry of FEV1 of 2.49 L with  72% predicted FVC of 2.45 L.    NOTE: Labs reviewed after the visit over the ensuing days. CBCD,  Chemistries, PT and PTT were all normal. D-dimer obtained was 0.49     Kalman Shan, MD  Electronically Signed    MR/MedQ  DD: 03/11/2007  DT: 03/12/2007  Job #: [id number]

## 2010-10-31 NOTE — Op Note (Signed)
NAME:  James Contreras, James Contreras                    ACCOUNT NO.:  [id number]   MEDICAL RECORD NO.:  [id number]          PATIENT TYPE:  AMB   LOCATION:  CARD                         FACILITY:  [facility]   PHYSICIAN:  Kalman Shan, MD   DATE OF BIRTH:  February 24, 1956   DATE OF PROCEDURE:  03/13/2007  DATE OF DISCHARGE:                               OPERATIVE REPORT   TIME OF PROCEDURE:  12:46 p.m. to 1312 hours on 03/13/2007   PROCEDURE PERFORMED:  Bronchoscopy with right lower lobe superior  segment lavage, brushing and transbronchial biopsy.   CONSENT:  Informed signed consent obtained from patient pre-procedure.   BRIEF PROCEDURE ASSESSMENT:  This is a 55 year old male with cough and  right upper lobe infiltrate.  History and physical was performed less  than 49 days old.  In fact it was performed on March 11, 2007.  No  changes are noted since then.  Today's physical exam showed height 72  inches, weight 194 pounds, blood pressure 144/99, pulse of 82,  respiratory rate 24.  ASA class 1 upper airway, Mallampati class was  class 3.  Physical exam was unchanged from clinic visit of March 11, 2007.  However, the wheezing was less and the respiratory therapist  thought it was upper airway wheeze and it resolved with lidocaine.   Preprocedure laboratory evaluation showed PT, PTT were normal.  Specifically, INR was 0.9, PTT was 30.  D-dimer was normal at 0.49,  hemoglobin 15.9 g%, platelet count 462,000.  His BUN was 13 and his  creatinine was 1.1 mg% and his albumen was 4.1%.   I deemed the patient acceptable for IV moderate sedation and to undergo  the procedure at 1236 hours.   Procedure plan was to do a bronchoscopy with airway exam, right lower  lobe superior segment lavage with brushings and a transbronchial  biopsies.  Sedation plan was to use lidocaine, fentanyl and Versed  moderate sedation.   PROCEDURE:  Bronchoscopy procedure.  After achieving moderate sedation  and adequately  anesthetizing his upper airway, the bronchoscope was  inserted at 1246 hours.  Detailed airway exam was performed.  It  appeared that the entire airway including the vocal cords, the trachea,  right main bronchus and left main bronchus all appeared diffusely red  but otherwise the airway anatomy was normal.  We did not think this  problem was related to white balancing of the bronchoscope.   After this, the right lower lobe superior segment was wedged and 30 mL  x3 of saline bronchoalveolar lavage was performed.  Under fluoroscopy  guidance, bronchial brushings x3 were performed.  After this  transbronchial biopsy x3 in the right lower lobe superior segment was  performed.  I am not sure that I was able to get the spot with a  transbronchial biopsies because of the location of the superior segment  atelectasis/consolidation.   Fluoroscopy total time was four minutes.  Immediate postprocedure, no  complications were observed.  Fluoroscopy immediate postprocedure did  not show any pneumothorax. The patient will be sent to recovery room in  good condition where he will be observed.  A chest x-ray will be done  and if no pneumothorax or complications he will be discharged home.   Specimens will be sent for microbiology, cytology and pathology.    NOTE: After procedure wife was very concerned about cough. I prescribled  empiric shot gun therapy with flovent, nasonex and protonix. On 9/26 I  had conversation with patient. He said he was still coughing. Prelim  results show polymorphs in lavage with GPC on gram stain. Patient will  call on 03/17/2007 to report progress      Kalman Shan, MD  Electronically Signed     MR/MEDQ  D:  03/13/2007  T:  03/14/2007  Job:  [id number]

## 2010-10-31 NOTE — Assessment & Plan Note (Signed)
[facility]                                 ON-CALL NOTE   SAMI, FROH                             MRN:          [id number]  DATE:09/16/2009                            DOB:          1956-04-26    I was called by Mr. Wachob who told me that he had had hemoptysis that  started on Wednesday and has spontaneously resolved.  This again  happened on Thursday in 2 separate incidents where he coughed up around  an ounce of blood each time.  Today, he coughed up 3 ounces of blood and  was concerned and asking what he should do.  He was not short of breath.  I told him that he needs to go to the ER to have this evaluated and he  should call an ambulance to transport him, he agreed with this.    ______________________________  Joie Bimler, MD    CH/MedQ  DD: 09/16/2009  DT: 09/17/2009  Job #: [id number]

## 2010-11-03 NOTE — Assessment & Plan Note (Signed)
[facility]                             PULMONARY OFFICE NOTE   ARGYLE, GUSTAFSON                             MRN:          [id number]  DATE:03/17/2007                            DOB:          06/25/1955    Mr. Benedict Kue had his bronchoscopy on March 13, 2007. After  procedure, his wife was extremely concerned about his cough, and given  the redness in his airways and cough out of proportion to his right  lower lobe superior segment infiltrate, I started him on Flovent,  Nasonex and Protonix. I called in today to get an update on symptoms and  also share results of the bronchoscopy. He felt that he was 20-30%  better in terms of the frequency of the cough, but still gets intense  coughing bouts that clearly make him very uncomfortable. In addition, he  is also troubled by right lower chest pain in the back that has been  made worse with cough.   Bronchioalveolar lavage showed a lot of polymorphs. Gram stain showed  gram positive cocci, but the culture result was consistent with normal  flora. Transbronchial biopsies was noncontributory. There was probably  one biopsy piece where a fragment of it suggested fibroproliferative  inflammation consistent with pneumonia, but the pieces were too small to  be characterized. The bronchial brush specimen just showed bronchial  epithelial cells.   I feel that the bronchoalveolar lavage with a lot of neutrophils was  consistent with acute pneumonia. I feel that cough is consistent with  breactive airways cough that follows infection. It is possible acid  reflux disease and post nasal drip are additional etiologies for cough  and therefore the need for the Flonase, the Flovent and the Protonix.  Chest pain is likely musculoskeletal (normal d-dimer). I shared my  impressions with him and his wife.   Plan  1. Continue the three drug empiric treatment for chronic cough and      follow expectantly  2. Reassured  that the cough is very likely reactive, and this could      take weeks to months to settle down.  3. Should have a CT scan of his chest on or after April 02, 2007 to      look for resolution of the consolidation.  4. Ibuprofen 400-800 mg tid for 5 days and then prn for pain. Warned      of GI and renal side-effects.  5. Towards the end of the conversation, the wife mentioned that the      Flovent seems to act as an irritant. I have told them to call in to      clinic tomorrow and try Symbicort.     Kalman Shan, MD  Electronically Signed    MR/MedQ  DD: 03/17/2007  DT: 03/18/2007  Job #: [id number]

## 2010-11-03 NOTE — Assessment & Plan Note (Signed)
[facility]                                 ON-CALL NOTE   AMALIO, LOE                             MRN:          [id number]  DATE:04/22/2007                            DOB:          1956/04/08    I, Dr. Marchelle Gearing, happened to review CT scan of the chest done on  April 11, 2007.  Of note, Mr. Schedler has had right lower lobe superior  segment consolidation/airspace disease with severe cough that was not  controllable.  CT scan on October 24 showed a near-total resolution of  the infiltrate with some residual inflammatory changes.  The nodularity  had completely resolved.  I personally reviewed this film and also  concur with the radiologist's report.  I called his home.  His wife said  that he was in, but not available to talk.  I shared the news of the CT  scan.  The wife said that his cough is 95% better but still has some  residual cough and there are also some occasional night sweats.  He took  inhalers only for 10 days after the bronchoscopy but still continues to  take his acid reflux medicines for the cough.  I explained that he needs  to come in so that we can reevaluate his medications for cough, and also  address the issue of night sweats.  His wife said that she did not know  if Mr. Alabi was willing to come, and that he would prefer not to come to  the clinic.  I also said that the radiologist recommended no further  followup of CT scan of chest, but to be on the safe side, perhaps do a  scan of the chest in 6 months.  She said she will pass the message on.  I left it for Mr. Vanness to book an appointment and come and see me.     Kalman Shan, MD  Electronically Signed    MR/MedQ  DD: 04/22/2007  DT: 04/23/2007  Job #: [id number]

## 2011-03-29 LAB — LEGIONELLA PROFILE(CULTURE+DFA/SMEAR): Legionella Antigen (DFA): NEGATIVE

## 2011-03-29 LAB — P CARINII SMEAR DFA

## 2011-03-29 LAB — FUNGUS CULTURE W SMEAR

## 2012-06-06 ENCOUNTER — Other Ambulatory Visit (HOSPITAL_COMMUNITY): Payer: Self-pay | Admitting: Internal Medicine

## 2012-06-06 ENCOUNTER — Ambulatory Visit (HOSPITAL_COMMUNITY)
Admission: RE | Admit: 2012-06-06 | Discharge: 2012-06-06 | Disposition: A | Discharge status: Home / Self Care | Payer: BC Managed Care – PPO | Source: Ambulatory Visit | Attending: Internal Medicine | Admitting: Internal Medicine

## 2012-06-06 DIAGNOSIS — R05 Cough: Secondary | ICD-10-CM | POA: Insufficient documentation

## 2012-06-06 DIAGNOSIS — R059 Cough, unspecified: Secondary | ICD-10-CM | POA: Insufficient documentation

## 2012-06-06 DIAGNOSIS — I10 Essential (primary) hypertension: Secondary | ICD-10-CM

## 2012-06-06 DIAGNOSIS — F17200 Nicotine dependence, unspecified, uncomplicated: Secondary | ICD-10-CM | POA: Insufficient documentation

## 2012-06-23 ENCOUNTER — Telehealth: Payer: Self-pay | Admitting: Internal Medicine

## 2012-06-23 NOTE — Telephone Encounter (Signed)
Spoke with pt's spouse She states that pt has had constant cough x 6 wks For the past few days has been prod with brown sputum and has also had fever No appts today, but I have scheduled visit with [facility] for tomorrow am She is to take him to UC or ED should symptoms worsen

## 2012-06-24 ENCOUNTER — Ambulatory Visit: Payer: BC Managed Care – PPO | Admitting: Pulmonary Disease

## 2012-06-24 ENCOUNTER — Encounter: Payer: Self-pay | Admitting: Internal Medicine

## 2012-08-08 ENCOUNTER — Encounter: Payer: BC Managed Care – PPO | Admitting: Internal Medicine

## 2012-08-27 ENCOUNTER — Ambulatory Visit (HOSPITAL_COMMUNITY)
Admission: RE | Admit: 2012-08-27 | Discharge: 2012-08-27 | Disposition: A | Discharge status: Home / Self Care | Payer: BC Managed Care – PPO | Source: Ambulatory Visit | Attending: Internal Medicine | Admitting: Internal Medicine

## 2012-08-27 ENCOUNTER — Other Ambulatory Visit (HOSPITAL_COMMUNITY): Payer: Self-pay | Admitting: Internal Medicine

## 2012-08-27 DIAGNOSIS — R059 Cough, unspecified: Secondary | ICD-10-CM | POA: Insufficient documentation

## 2012-08-27 DIAGNOSIS — J984 Other disorders of lung: Secondary | ICD-10-CM | POA: Insufficient documentation

## 2012-08-27 DIAGNOSIS — R05 Cough: Secondary | ICD-10-CM | POA: Insufficient documentation

## 2012-08-27 DIAGNOSIS — J189 Pneumonia, unspecified organism: Secondary | ICD-10-CM

## 2013-06-09 ENCOUNTER — Encounter: Payer: Self-pay | Admitting: Internal Medicine

## 2013-08-22 DIAGNOSIS — E559 Vitamin D deficiency, unspecified: Secondary | ICD-10-CM | POA: Insufficient documentation

## 2013-08-22 DIAGNOSIS — E039 Hypothyroidism, unspecified: Secondary | ICD-10-CM | POA: Insufficient documentation

## 2013-08-22 DIAGNOSIS — E349 Endocrine disorder, unspecified: Secondary | ICD-10-CM | POA: Insufficient documentation

## 2013-08-22 DIAGNOSIS — M109 Gout, unspecified: Secondary | ICD-10-CM | POA: Insufficient documentation

## 2013-08-28 ENCOUNTER — Encounter: Payer: Self-pay | Admitting: Internal Medicine

## 2013-08-28 ENCOUNTER — Ambulatory Visit (INDEPENDENT_AMBULATORY_CARE_PROVIDER_SITE_OTHER): Payer: BC Managed Care – PPO | Admitting: Internal Medicine

## 2013-08-28 VITALS — BP 142/90 | HR 64 | Temp 98.2°F | Resp 16 | Ht 71.25 in | Wt 195.2 lb

## 2013-08-28 DIAGNOSIS — E559 Vitamin D deficiency, unspecified: Secondary | ICD-10-CM

## 2013-08-28 DIAGNOSIS — R7303 Prediabetes: Secondary | ICD-10-CM | POA: Insufficient documentation

## 2013-08-28 DIAGNOSIS — R740 Nonspecific elevation of levels of transaminase and lactic acid dehydrogenase [LDH]: Secondary | ICD-10-CM

## 2013-08-28 DIAGNOSIS — J1520 Pneumonia due to staphylococcus, unspecified: Secondary | ICD-10-CM

## 2013-08-28 DIAGNOSIS — Z79899 Other long term (current) drug therapy: Secondary | ICD-10-CM

## 2013-08-28 DIAGNOSIS — Z111 Encounter for screening for respiratory tuberculosis: Secondary | ICD-10-CM

## 2013-08-28 DIAGNOSIS — R7402 Elevation of levels of lactic acid dehydrogenase (LDH): Secondary | ICD-10-CM

## 2013-08-28 DIAGNOSIS — Z1212 Encounter for screening for malignant neoplasm of rectum: Secondary | ICD-10-CM

## 2013-08-28 DIAGNOSIS — I10 Essential (primary) hypertension: Secondary | ICD-10-CM

## 2013-08-28 DIAGNOSIS — R7309 Other abnormal glucose: Secondary | ICD-10-CM

## 2013-08-28 DIAGNOSIS — Z125 Encounter for screening for malignant neoplasm of prostate: Secondary | ICD-10-CM

## 2013-08-28 DIAGNOSIS — Z113 Encounter for screening for infections with a predominantly sexual mode of transmission: Secondary | ICD-10-CM

## 2013-08-28 DIAGNOSIS — Z0000 Encounter for general adult medical examination without abnormal findings: Secondary | ICD-10-CM

## 2013-08-28 LAB — CBC WITH DIFFERENTIAL/PLATELET
BASOS PCT: 1 % (ref 0–1)
Basophils Absolute: 0.1 10*3/uL (ref 0.0–0.1)
EOS ABS: 0.2 10*3/uL (ref 0.0–0.7)
Eosinophils Relative: 3 % (ref 0–5)
HCT: 51.2 % (ref 39.0–52.0)
Hemoglobin: 18 g/dL — ABNORMAL HIGH (ref 13.0–17.0)
Lymphocytes Relative: 17 % (ref 12–46)
Lymphs Abs: 1.1 10*3/uL (ref 0.7–4.0)
MCH: 29.7 pg (ref 26.0–34.0)
MCHC: 35.2 g/dL (ref 30.0–36.0)
MCV: 84.3 fL (ref 78.0–100.0)
Monocytes Absolute: 0.7 10*3/uL (ref 0.1–1.0)
Monocytes Relative: 10 % (ref 3–12)
NEUTROS PCT: 69 % (ref 43–77)
Neutro Abs: 4.6 10*3/uL (ref 1.7–7.7)
Platelets: 204 10*3/uL (ref 150–400)
RBC: 6.07 MIL/uL — AB (ref 4.22–5.81)
RDW: 14.3 % (ref 11.5–15.5)
WBC: 6.6 10*3/uL (ref 4.0–10.5)

## 2013-08-28 LAB — HEMOGLOBIN A1C
Hgb A1c MFr Bld: 5.1 % (ref <5.7)
Mean Plasma Glucose: 100 mg/dL (ref <117)

## 2013-08-28 MED ORDER — LEVOTHYROXINE SODIUM 75 MCG PO TABS: 75.0000 ug | ORAL_TABLET | Freq: Every day | ORAL | Status: DC

## 2013-08-28 MED ORDER — ATORVASTATIN CALCIUM 80 MG PO TABS: 80.0000 mg | ORAL_TABLET | Freq: Every day | ORAL | Status: DC

## 2013-08-28 MED ORDER — DILTIAZEM HCL ER 240 MG PO CP24: 240.0000 mg | ORAL_CAPSULE | Freq: Every day | ORAL | Status: DC

## 2013-08-28 MED ORDER — BUDESONIDE-FORMOTEROL FUMARATE 160-4.5 MCG/ACT IN AERO: 2.0000 | INHALATION_SPRAY | Freq: Two times a day (BID) | RESPIRATORY_TRACT | Status: DC

## 2013-08-28 MED ORDER — TESTOSTERONE CYPIONATE 200 MG/ML IM SOLN: 200.0000 mg | INTRAMUSCULAR | Status: DC

## 2013-08-28 NOTE — Patient Instructions (Signed)

## 2013-08-28 NOTE — Progress Notes (Signed)
Patient ID: James Contreras, male   DOB: 1956-05-10, 58 y.o.   MRN: [id number]   Annual Screening Comprehensive Examination  This very nice 58 y.o.  MWM presents for complete physical.  Patient has been followed for HTN, Diabetes  Prediabetes, Hyperlipidemia, Testosterone and Vitamin D Deficiency.   HTN predates since 1995. Patient's BP has not been monitored at home as he has felt well and in fact he has been lost to F/U and last seen 1 year ago. .Today's BP: 142/90 mmHg. Patient denies any cardiac symptoms as chest pain, palpitations, shortness of breath, dizziness or ankle swelling.   Patient's hyperlipidemia is controlled with diet and medications. Patient denies myalgias or other medication SE's. Last cholesterol last visit was 182, triglycerides 234, HDL 36 and LDL 99 in Mar 2014 - at goal.     Finally, patient has history of Testosterone Deficiency  With low level of 121 in 2013 and is on replacement therapy with improved sense of well being and stamina. Also, he has Hx/o Vitamin D Deficiency of 25 in 2011 with last vitamin D 70 in Mar 2014.    Medication List       atorvastatin 80 MG tablet  Commonly known as:  LIPITOR  Take 1 tablet (80 mg total) by mouth daily.     budesonide-formoterol 160-4.5 MCG/ACT inhaler  Commonly known as:  SYMBICORT  Inhale 2 puffs into the lungs 2 (two) times daily.     diltiazem 240 MG 24 hr capsule  Commonly known as:  DILACOR XR  Take 1 capsule (240 mg total) by mouth daily.     levothyroxine 75 MCG tablet  Commonly known as:  SYNTHROID, LEVOTHROID  Take 1 tablet (75 mcg total) by mouth daily before breakfast.     testosterone cypionate 200 MG/ML injection  Commonly known as:  DEPOTESTOTERONE CYPIONATE  Inject 1 mL (200 mg total) into the muscle every 14 (fourteen) days.     VITAMIN D PO  Take 4,000 Int'l Units by mouth.          No Known Allergies  Past Medical History  Diagnosis Date  . Gout   . Vitamin D deficiency   . Other  testicular hypofunction   . Unspecified hypothyroidism 2012  . Hypertension 1995  . Hyperlipidemia 2010    Past Surgical History  Procedure Laterality Date  . Bronchial biopsy  2010    negative    Family History  Problem Relation Age of Onset  . Cancer Mother     breast  . Hypertension Sister   . Hyperlipidemia Sister     History   Social History  . Marital Status: Married    Spouse Name: N/A    Number of Children: N/A  . Years of Education: N/A   Occupational History  . Not on file.   Social History Main Topics  . Smoking status: Former Smoker    Quit date: 06/18/1982  . Smokeless tobacco: Not on file  . Alcohol Use: 4.0 oz/week    8 drink(s) per week     Comment: Social  . Drug Use: No  . Sexual Activity: Not on file   Other Topics Concern  . Not on file   Social History Narrative  . No narrative on file    ROS Constitutional: Denies fever, chills, weight loss/gain, headaches, insomnia, fatigue, night sweats, and change in appetite. Eyes: Denies redness, blurred vision, diplopia, discharge, itchy, watery eyes.  ENT: Denies discharge, congestion, post nasal drip, epistaxis,  sore throat, earache, hearing loss, dental pain, Tinnitus, Vertigo, Sinus pain, snoring.  Cardio: Denies chest pain, palpitations, irregular heartbeat, syncope, dyspnea, diaphoresis, orthopnea, PND, claudication, edema Respiratory: denies cough, dyspnea, DOE, pleurisy, hoarseness, laryngitis, wheezing.  Gastrointestinal: Denies dysphagia, heartburn, reflux, water brash, pain, cramps, nausea, vomiting, bloating, diarrhea, constipation, hematemesis, melena, hematochezia, jaundice, hemorrhoids Genitourinary: Denies dysuria, frequency, urgency, nocturia, hesitancy, discharge, hematuria, flank pain Musculoskeletal: Denies arthralgia, myalgia, stiffness, Jt. Swelling, pain, limp, and strain/sprain. Skin: Denies puritis, rash, hives, warts, acne, eczema, changing in skin lesion Neuro: No  weakness, tremor, incoordination, spasms, paresthesia, pain Psychiatric: Denies confusion, memory loss, sensory loss Endocrine: Denies change in weight, skin, hair change, nocturia, and paresthesia, diabetic polys, visual blurring, hyper / hypo glycemic episodes.  Heme/Lymph: No excessive bleeding, bruising, or elarged lymph nodes.  Physical Exam  BP 142/90  Pulse 64  Temp(Src) 98.2 F (36.8 C) (Temporal)  Resp 16  Ht 5' 11.25" (1.81 m)  Wt 195 lb 3.2 oz (88.542 kg)  BMI 27.03 kg/m2  General Appearance: Well nourished, in no apparent distress. Eyes: PERRLA, EOMs, conjunctiva no swelling or erythema, normal fundi and vessels. Sinuses: No frontal/maxillary tenderness ENT/Mouth: EACs patent / TMs  nl. Nares clear without erythema, swelling, mucoid exudates. Oral hygiene is good. No erythema, swelling, or exudate. Tongue normal, non-obstructing. Tonsils not swollen or erythematous. Hearing normal.  Neck: Supple, thyroid normal. No bruits, nodes or JVD. Respiratory: Respiratory effort normal.  BS equal and clear bilateral without rales, rhonci, wheezing or stridor. Cardio: Heart sounds are normal with regular rate and rhythm and no murmurs, rubs or gallops. Peripheral pulses are normal and equal bilaterally without edema. No aortic or femoral bruits. Chest: symmetric with normal excursions and percussion.  Abdomen: Flat, soft, with bowl sounds. Nontender, no guarding, rebound, hernias, masses, or organomegaly.  Lymphatics: Non tender without lymphadenopathy.  Genitourinary: No hernias.Testes nl. DRE - prostate nl for age - smooth & firm w/o nodules. Musculoskeletal: Full ROM all peripheral extremities, joint stability, 5/5 strength, and normal gait. Skin: Warm and dry without rashes, lesions, cyanosis, clubbing or  ecchymosis.  Neuro: Cranial nerves intact, reflexes equal bilaterally. Normal muscle tone, no cerebellar symptoms. Sensation intact.  Pysch: Awake and oriented X 3, normal affect,  insight and judgment appropriate.   Assessment and Plan  1. Annual Screening Examination 2. Hypertension  3. Hyperlipidemia 4. Pre Diabetes 5. Vitamin D Deficiency 6. Testosterone Deficiency 7. Hx Pulmonary Sarcoid in remission 8. Hypothyroidism  Continue prudent diet as discussed, weight control, BP monitoring, regular exercise, and medications as discussed.  Discussed med effects and SE's. Routine screening labs and tests as requested with regular follow-up as recommended.

## 2013-08-29 LAB — TESTOSTERONE: Testosterone: 269 ng/dL — ABNORMAL LOW (ref 300–890)

## 2013-08-29 LAB — URINALYSIS, MICROSCOPIC ONLY
BACTERIA UA: NONE SEEN
CASTS: NONE SEEN
CRYSTALS: NONE SEEN
Squamous Epithelial / LPF: NONE SEEN

## 2013-08-29 LAB — MICROALBUMIN / CREATININE URINE RATIO
Creatinine, Urine: 150 mg/dL
MICROALB/CREAT RATIO: 4.2 mg/g (ref 0.0–30.0)
Microalb, Ur: 0.63 mg/dL (ref 0.00–1.89)

## 2013-08-29 LAB — RPR

## 2013-08-29 LAB — BASIC METABOLIC PANEL WITH GFR
BUN: 12 mg/dL (ref 6–23)
CALCIUM: 9.1 mg/dL (ref 8.4–10.5)
CO2: 27 mEq/L (ref 19–32)
CREATININE: 1.05 mg/dL (ref 0.50–1.35)
Chloride: 104 mEq/L (ref 96–112)
GFR, EST AFRICAN AMERICAN: 89 mL/min
GFR, Est Non African American: 77 mL/min
Glucose, Bld: 80 mg/dL (ref 70–99)
Potassium: 4.2 mEq/L (ref 3.5–5.3)
Sodium: 140 mEq/L (ref 135–145)

## 2013-08-29 LAB — HIV ANTIBODY (ROUTINE TESTING W REFLEX): HIV: NONREACTIVE

## 2013-08-29 LAB — HEPATIC FUNCTION PANEL
ALBUMIN: 4.4 g/dL (ref 3.5–5.2)
ALT: 31 U/L (ref 0–53)
AST: 19 U/L (ref 0–37)
Alkaline Phosphatase: 75 U/L (ref 39–117)
BILIRUBIN DIRECT: 0.1 mg/dL (ref 0.0–0.3)
BILIRUBIN TOTAL: 0.7 mg/dL (ref 0.2–1.2)
Indirect Bilirubin: 0.6 mg/dL (ref 0.2–1.2)
Total Protein: 6.6 g/dL (ref 6.0–8.3)

## 2013-08-29 LAB — HEPATITIS B CORE ANTIBODY, TOTAL: Hep B Core Total Ab: NONREACTIVE

## 2013-08-29 LAB — HEPATITIS B SURFACE ANTIBODY,QUALITATIVE: Hep B S Ab: NEGATIVE

## 2013-08-29 LAB — LIPID PANEL
CHOL/HDL RATIO: 3.7 ratio
CHOLESTEROL: 148 mg/dL (ref 0–200)
HDL: 40 mg/dL (ref >39)
LDL Cholesterol: 79 mg/dL (ref 0–99)
Triglycerides: 147 mg/dL (ref <150)
VLDL: 29 mg/dL (ref 0–40)

## 2013-08-29 LAB — INSULIN, FASTING: Insulin fasting, serum: 35 u[IU]/mL — ABNORMAL HIGH (ref 3–28)

## 2013-08-29 LAB — HEPATITIS C ANTIBODY: HCV Ab: NEGATIVE

## 2013-08-29 LAB — TSH: TSH: 2.623 u[IU]/mL (ref 0.350–4.500)

## 2013-08-29 LAB — PSA: PSA: 0.65 ng/mL (ref <=4.00)

## 2013-08-29 LAB — HEPATITIS A ANTIBODY, TOTAL: Hep A Total Ab: NONREACTIVE

## 2013-08-29 LAB — MAGNESIUM: MAGNESIUM: 1.9 mg/dL (ref 1.5–2.5)

## 2013-08-29 LAB — VITAMIN D 25 HYDROXY (VIT D DEFICIENCY, FRACTURES): Vit D, 25-Hydroxy: 90 ng/mL — ABNORMAL HIGH (ref 30–89)

## 2013-08-29 LAB — VITAMIN B12: Vitamin B-12: 347 pg/mL (ref 211–911)

## 2013-08-31 LAB — HEPATITIS B E ANTIBODY: Hepatitis Be Antibody: NONREACTIVE

## 2013-09-03 LAB — TB SKIN TEST
Induration: 0 mm
TB Skin Test: NEGATIVE

## 2014-03-20 ENCOUNTER — Other Ambulatory Visit: Payer: Self-pay | Admitting: Internal Medicine

## 2014-03-22 ENCOUNTER — Other Ambulatory Visit: Payer: Self-pay | Admitting: Internal Medicine

## 2014-03-31 ENCOUNTER — Ambulatory Visit (INDEPENDENT_AMBULATORY_CARE_PROVIDER_SITE_OTHER): Payer: BC Managed Care – PPO | Admitting: Internal Medicine

## 2014-03-31 ENCOUNTER — Encounter: Payer: Self-pay | Admitting: Internal Medicine

## 2014-03-31 VITALS — BP 128/80 | HR 88 | Temp 98.1°F | Resp 16 | Ht 71.5 in | Wt 193.8 lb

## 2014-03-31 DIAGNOSIS — L02419 Cutaneous abscess of limb, unspecified: Secondary | ICD-10-CM

## 2014-03-31 DIAGNOSIS — L03119 Cellulitis of unspecified part of limb: Secondary | ICD-10-CM

## 2014-03-31 DIAGNOSIS — Z23 Encounter for immunization: Secondary | ICD-10-CM

## 2014-03-31 MED ORDER — DOXYCYCLINE HYCLATE 100 MG PO CAPS: ORAL_CAPSULE | ORAL | Status: DC

## 2014-03-31 NOTE — Progress Notes (Signed)
   Subjective:    Patient ID: James Contreras, male    DOB: 1956-02-05, 58 y.o.   MRN: [id number]  HPI Patient presents with a tender red area of his left leg  Meds/Allergies/ PMH - reviewed  Review of Systems neg except as above  Objective:   Physical Exam BP 128/80  Pulse 88  Temp(Src) 98.1 F (36.7 C) (Temporal)  Resp 16  Ht 5' 11.5" (1.816 m)  Wt 193 lb 12.8 oz (87.907 kg)  BMI 26.66 kg/m2  There an obvious area of infected multiloculated cysts of the lateral left mid calf with surrounding tender erythemia.  Assessment & Plan:   1. Cellulitis and abscess of leg  - doxycycline (VIBRAMYCIN) 100 MG capsule; Take 2 capsules at once on a full stomach, then take 1 capsule  2 x day on a full stomach for infection for 2 weeks  Dispense: 30 capsule; Refill: 1  - Discussed meds/SEs and ROV if not improve.

## 2014-03-31 NOTE — Addendum Note (Signed)
Addended by: Valrie HartEVANS, Vane Yapp C on: 03/31/2014 03:11 PM   Modules accepted: Orders

## 2014-03-31 NOTE — Patient Instructions (Signed)

## 2014-08-03 ENCOUNTER — Other Ambulatory Visit: Payer: Self-pay | Admitting: Physician Assistant

## 2014-08-31 ENCOUNTER — Encounter: Payer: Self-pay | Admitting: Internal Medicine

## 2014-08-31 ENCOUNTER — Ambulatory Visit (INDEPENDENT_AMBULATORY_CARE_PROVIDER_SITE_OTHER): Payer: 59 | Admitting: Internal Medicine

## 2014-08-31 VITALS — BP 148/90 | HR 80 | Temp 97.9°F | Resp 16 | Ht 72.0 in | Wt 199.8 lb

## 2014-08-31 DIAGNOSIS — E349 Endocrine disorder, unspecified: Secondary | ICD-10-CM

## 2014-08-31 DIAGNOSIS — Z79899 Other long term (current) drug therapy: Secondary | ICD-10-CM

## 2014-08-31 DIAGNOSIS — Z111 Encounter for screening for respiratory tuberculosis: Secondary | ICD-10-CM

## 2014-08-31 DIAGNOSIS — Z125 Encounter for screening for malignant neoplasm of prostate: Secondary | ICD-10-CM

## 2014-08-31 DIAGNOSIS — I10 Essential (primary) hypertension: Secondary | ICD-10-CM

## 2014-08-31 DIAGNOSIS — Z1212 Encounter for screening for malignant neoplasm of rectum: Secondary | ICD-10-CM

## 2014-08-31 DIAGNOSIS — R7303 Prediabetes: Secondary | ICD-10-CM

## 2014-08-31 DIAGNOSIS — M1000 Idiopathic gout, unspecified site: Secondary | ICD-10-CM

## 2014-08-31 DIAGNOSIS — E782 Mixed hyperlipidemia: Secondary | ICD-10-CM

## 2014-08-31 DIAGNOSIS — R7309 Other abnormal glucose: Secondary | ICD-10-CM

## 2014-08-31 DIAGNOSIS — R5383 Other fatigue: Secondary | ICD-10-CM

## 2014-08-31 DIAGNOSIS — E559 Vitamin D deficiency, unspecified: Secondary | ICD-10-CM

## 2014-08-31 DIAGNOSIS — E039 Hypothyroidism, unspecified: Secondary | ICD-10-CM

## 2014-08-31 NOTE — Patient Instructions (Signed)

## 2014-08-31 NOTE — Progress Notes (Signed)
Patient ID: James Contreras, male   DOB: 1955/12/13, 59 y.o.   MRN: [id number] Annual Comprehensive Examination  This very nice 59 y.o. MWM presents for complete physical.  Patient has been followed for HTN, T2_NIDDM  Prediabetes, Hyperlipidemia, and Vitamin D Deficiency.   HTN predates since 1995 . Patient's BP has been controlled at home.Today's BP is sl elevated at 148/90 and drops to 128/84 on recheck. Patient denies any cardiac symptoms as chest pain, palpitations, shortness of breath, dizziness or ankle swelling.   Patient's hyperlipidemia is controlled with diet and medications. Patient denies myalgias or other medication SE's. Last lipids were at goal as below.   Lab Results  Component Value Date   CHOL 148 08/28/2013   HDL 40 08/28/2013   LDLCALC 79 08/28/2013   TRIG 147 08/28/2013   CHOLHDL 3.7 08/28/2013    Patient is screened for  prediabetes  and patient denies reactive hypoglycemic symptoms, visual blurring, diabetic polys or paresthesias. Last A1c was 5.1% in Mar 2015.     Patient has been on Testosterone replacement with hx/o low Testosterone of 192 in 2011 and 121 in 2013. Patient reports improved sense of well-being since on therapy. Finally, patient has history of Vitamin D Deficiency of 25 in 2011 and last vitamin D was 90 in Mar 2015.  Medication Sig  . allopurinol  300 MG tablet TAKE ONE TABLET BY MOUTH ONCE DAILY  . atorvastatin 80 MG tablet Take 1 tablet (80 mg total) by mouth daily.  Marland Kitchen VITAMIN D  Take 4,000 Int'l Units by mouth.  . diltiazem (DILACOR XR) 240  Take 1 capsule (240 mg total) by mouth daily.  Marland Kitchen levothyroxine  75 MCG tablet Take 1 tablet (75 mcg total) by mouth daily before breakfast.  . DEPO-TESTOTERONE  200 MG/ML injection Inject 1 to 2 ml Intra Muscular every 2 weeks as directed  . SYMBICORT 160-4.5  inhaler Inhale 2 puffs into the lungs 2 (two) times daily.   No facility-administered medications prior to visit.   No Known Allergies Past Medical  History  Diagnosis Date  . Gout   . Vitamin D deficiency   . Other testicular hypofunction   . Unspecified hypothyroidism 2012  . Hypertension 1995  . Hyperlipidemia 2010   Health Maintenance  Topic Date Due  . COLONOSCOPY  07/18/2003  . ZOSTAVAX  07/17/2013  . INFLUENZA VACCINE  01/17/2015  . TETANUS/TDAP  05/30/2022  . HIV Screening  Completed   Immunization History  Administered Date(s) Administered  . Influenza Split 03/31/2014  . PPD Test 08/28/2013, 08/31/2014  . Pneumococcal-Unspecified 06/18/2008  . Tdap 05/30/2012   Past Surgical History  Procedure Laterality Date  . Bronchial biopsy  2010    negative   Family History  Problem Relation Age of Onset  . Cancer Mother     breast  . Hypertension Sister   . Hyperlipidemia Sister     History   Social History  . Marital Status: Married    Spouse Name: N/A  . Number of Children: N/A  . Years of Education: N/A   Occupational History  . 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 Smoking status: Former Smoker    Quit date: 06/18/1982  . Smokeless tobacco: Not on file  . Alcohol Use: 4.0 oz/week    8 drink(s) per week     Comment: Social  . Drug Use: No  . Sexual Activity: Not on file  ROS Constitutional: Denies fever, chills, weight loss/gain, headaches, insomnia, fatigue, night sweats or change in appetite. Eyes: Denies redness, blurred vision, diplopia, discharge, itchy or watery eyes.  ENT: Denies discharge, congestion, post nasal drip, epistaxis, sore throat, earache, hearing loss, dental pain, Tinnitus, Vertigo, Sinus pain or snoring.  Cardio: Denies chest pain, palpitations, irregular heartbeat, syncope, dyspnea, diaphoresis, orthopnea, PND, claudication or edema Respiratory: denies cough, dyspnea, DOE, pleurisy, hoarseness, laryngitis or wheezing.  Gastrointestinal: Denies dysphagia, heartburn, reflux, water brash, pain, cramps, nausea, vomiting, bloating,  diarrhea, constipation, hematemesis, melena, hematochezia, jaundice or hemorrhoids Genitourinary: Denies dysuria, frequency, urgency, nocturia, hesitancy, discharge, hematuria or flank pain Musculoskeletal: Denies arthralgia, myalgia, stiffness, Jt. Swelling, pain, limp or strain/sprain. Denies Falls. Skin: Denies puritis, rash, hives, warts, acne, eczema or change in skin lesion Neuro: No weakness, tremor, incoordination, spasms, paresthesia or pain Psychiatric: Denies confusion, memory loss or sensory loss. Denies Depression. Endocrine: Denies change in weight, skin, hair change, nocturia, and paresthesia, diabetic polys, visual blurring or hyper / hypo glycemic episodes.  Heme/Lymph: No excessive bleeding, bruising or enlarged lymph nodes.  Physical Exam  BP 148/90   Pulse 80  Temp 97.9 F   Resp 16  Ht 6'   Wt 199 lb 12.8 oz     BMI 27.09   General Appearance: Well nourished, in no apparent distress. Eyes: PERRLA, EOMs, conjunctiva no swelling or erythema, normal fundi and vessels. Sinuses: No frontal/maxillary tenderness ENT/Mouth: EACs patent / TMs  nl. Nares clear without erythema, swelling, mucoid exudates. Oral hygiene is good. No erythema, swelling, or exudate. Tongue normal, non-obstructing. Tonsils not swollen or erythematous. Hearing normal.  Neck: Supple, thyroid normal. No bruits, nodes or JVD. Respiratory: Respiratory effort normal.  BS equal and clear bilateral without rales, rhonci, wheezing or stridor. Cardio: Heart sounds are normal with regular rate and rhythm and no murmurs, rubs or gallops. Peripheral pulses are normal and equal bilaterally without edema. No aortic or femoral bruits. Chest: symmetric with normal excursions and percussion.  Abdomen: Flat, soft, with bowl sounds. Nontender, no guarding, rebound, hernias, masses, or organomegaly.  Lymphatics: Non tender without lymphadenopathy.  Genitourinary: No hernias.Testes nl. DRE - prostate nl for age - smooth &  firm w/o nodules. Musculoskeletal: Full ROM all peripheral extremities, joint stability, 5/5 strength, and normal gait. Skin: Warm and dry without rashes, lesions, cyanosis, clubbing or  ecchymosis.  Neuro: Cranial nerves intact, reflexes equal bilaterally. Normal muscle tone, no cerebellar symptoms. Sensation intact.  Pysch: Awake and oriented X 3 with normal affect, insight and judgment appropriate.   Assessment and Plan  1. Essential hypertension  - Microalbumin / creatinine urine ratio - EKG 12-Lead - US, RETROPERITNL ABD,  LTD  2. Mixed hyperlipidemia  - Lipid panel  3. Prediabetes  - Hemoglobin A1c - Insulin, random  4. Vitamin D deficiency  - Vit D  25 hydroxy   5. Hypothyroidism  - TSH  6. Idiopathic gout  - Uric acid  7. Testosterone deficiency  - Testosterone  8. Other fatigue  - Vitamin B12 - Iron and TIBC  9. Screening for rectal cancer  - POC Hemoccult Bld/Stl   10. Prostate cancer screening  - PSA  11. Screening examination for pulmonary tuberculosis  - PPD  12. Medication management  - Urine Microscopic - CBC with Differential/Platelet - BASIC METABOLIC PANEL WITH GFR - Hepatic function panel - Magnesium   Continue prudent diet as discussed, weight control, BP monitoring, regular exercise, and medications as discussed.  Discussed med effects and  SE's. Routine screening labs and tests as requested with regular follow-up as recommended.

## 2014-09-01 LAB — BASIC METABOLIC PANEL WITH GFR
BUN: 15 mg/dL (ref 6–23)
CALCIUM: 9 mg/dL (ref 8.4–10.5)
CO2: 28 mEq/L (ref 19–32)
CREATININE: 0.95 mg/dL (ref 0.50–1.35)
Chloride: 102 mEq/L (ref 96–112)
GFR, Est African American: >89 mL/min
GFR, Est Non African American: 86 mL/min
GLUCOSE: 82 mg/dL (ref 70–99)
Potassium: 4.7 mEq/L (ref 3.5–5.3)
Sodium: 140 mEq/L (ref 135–145)

## 2014-09-01 LAB — MICROALBUMIN / CREATININE URINE RATIO
CREATININE, URINE: 209.6 mg/dL
Microalb Creat Ratio: 2.9 mg/g (ref 0.0–30.0)
Microalb, Ur: 0.6 mg/dL (ref <2.0)

## 2014-09-01 LAB — CBC WITH DIFFERENTIAL/PLATELET
Basophils Absolute: 0 10*3/uL (ref 0.0–0.1)
Basophils Relative: 0 % (ref 0–1)
EOS ABS: 0.3 10*3/uL (ref 0.0–0.7)
EOS PCT: 5 % (ref 0–5)
HEMATOCRIT: 47.3 % (ref 39.0–52.0)
Hemoglobin: 15.7 g/dL (ref 13.0–17.0)
LYMPHS PCT: 20 % (ref 12–46)
Lymphs Abs: 1.1 10*3/uL (ref 0.7–4.0)
MCH: 28.8 pg (ref 26.0–34.0)
MCHC: 33.2 g/dL (ref 30.0–36.0)
MCV: 86.6 fL (ref 78.0–100.0)
MONOS PCT: 13 % — AB (ref 3–12)
MPV: 10.5 fL (ref 8.6–12.4)
Monocytes Absolute: 0.7 10*3/uL (ref 0.1–1.0)
Neutro Abs: 3.4 10*3/uL (ref 1.7–7.7)
Neutrophils Relative %: 62 % (ref 43–77)
Platelets: 185 10*3/uL (ref 150–400)
RBC: 5.46 MIL/uL (ref 4.22–5.81)
RDW: 13.9 % (ref 11.5–15.5)
WBC: 5.5 10*3/uL (ref 4.0–10.5)

## 2014-09-01 LAB — HEMOGLOBIN A1C
HEMOGLOBIN A1C: 5.4 % (ref <5.7)
MEAN PLASMA GLUCOSE: 108 mg/dL (ref <117)

## 2014-09-01 LAB — IRON AND TIBC
%SAT: 35 % (ref 20–55)
Iron: 134 ug/dL (ref 42–165)
TIBC: 383 ug/dL (ref 215–435)
UIBC: 249 ug/dL (ref 125–400)

## 2014-09-01 LAB — URINALYSIS, MICROSCOPIC ONLY
Bacteria, UA: NONE SEEN
Casts: NONE SEEN
Squamous Epithelial / LPF: NONE SEEN

## 2014-09-01 LAB — VITAMIN B12: Vitamin B-12: 387 pg/mL (ref 211–911)

## 2014-09-01 LAB — TESTOSTERONE: TESTOSTERONE: 917 ng/dL — AB (ref 300–890)

## 2014-09-01 LAB — HEPATIC FUNCTION PANEL
ALT: 22 U/L (ref 0–53)
AST: 19 U/L (ref 0–37)
Albumin: 4.2 g/dL (ref 3.5–5.2)
Alkaline Phosphatase: 67 U/L (ref 39–117)
BILIRUBIN INDIRECT: 0.7 mg/dL (ref 0.2–1.2)
BILIRUBIN TOTAL: 0.9 mg/dL (ref 0.2–1.2)
Bilirubin, Direct: 0.2 mg/dL (ref 0.0–0.3)
Total Protein: 6.4 g/dL (ref 6.0–8.3)

## 2014-09-01 LAB — LIPID PANEL
Cholesterol: 125 mg/dL (ref 0–200)
HDL: 26 mg/dL — AB (ref >=40)
LDL Cholesterol: 70 mg/dL (ref 0–99)
Total CHOL/HDL Ratio: 4.8 Ratio
Triglycerides: 147 mg/dL (ref <150)
VLDL: 29 mg/dL (ref 0–40)

## 2014-09-01 LAB — INSULIN, RANDOM: Insulin: 10.9 u[IU]/mL (ref 2.0–19.6)

## 2014-09-01 LAB — MAGNESIUM: Magnesium: 2 mg/dL (ref 1.5–2.5)

## 2014-09-01 LAB — PSA: PSA: 0.76 ng/mL (ref <=4.00)

## 2014-09-01 LAB — URIC ACID: URIC ACID, SERUM: 5.4 mg/dL (ref 4.0–7.8)

## 2014-09-01 LAB — VITAMIN D 25 HYDROXY (VIT D DEFICIENCY, FRACTURES): Vit D, 25-Hydroxy: 61 ng/mL (ref 30–100)

## 2014-09-01 LAB — TSH: TSH: 3.146 u[IU]/mL (ref 0.350–4.500)

## 2014-09-09 ENCOUNTER — Other Ambulatory Visit: Payer: Self-pay | Admitting: Internal Medicine

## 2014-10-05 ENCOUNTER — Other Ambulatory Visit: Payer: Self-pay | Admitting: *Deleted

## 2014-10-05 MED ORDER — DILTIAZEM HCL ER 240 MG PO CP24: 240.0000 mg | ORAL_CAPSULE | Freq: Every day | ORAL | Status: DC

## 2014-10-12 LAB — TB SKIN TEST
Induration: 0 mm
TB Skin Test: NEGATIVE

## 2014-11-02 ENCOUNTER — Other Ambulatory Visit: Payer: Self-pay | Admitting: Internal Medicine

## 2014-11-02 DIAGNOSIS — E349 Endocrine disorder, unspecified: Secondary | ICD-10-CM

## 2014-12-06 ENCOUNTER — Other Ambulatory Visit: Payer: Self-pay | Admitting: Internal Medicine

## 2014-12-10 ENCOUNTER — Encounter: Payer: Self-pay | Admitting: Internal Medicine

## 2014-12-10 ENCOUNTER — Ambulatory Visit (INDEPENDENT_AMBULATORY_CARE_PROVIDER_SITE_OTHER): Payer: 59 | Admitting: Internal Medicine

## 2014-12-10 VITALS — BP 164/96 | HR 90 | Temp 98.4°F | Resp 18 | Ht 72.0 in | Wt 202.0 lb

## 2014-12-10 DIAGNOSIS — E559 Vitamin D deficiency, unspecified: Secondary | ICD-10-CM

## 2014-12-10 DIAGNOSIS — R7309 Other abnormal glucose: Secondary | ICD-10-CM

## 2014-12-10 DIAGNOSIS — W57XXXA Bitten or stung by nonvenomous insect and other nonvenomous arthropods, initial encounter: Secondary | ICD-10-CM

## 2014-12-10 DIAGNOSIS — E782 Mixed hyperlipidemia: Secondary | ICD-10-CM

## 2014-12-10 DIAGNOSIS — I10 Essential (primary) hypertension: Secondary | ICD-10-CM

## 2014-12-10 DIAGNOSIS — R7303 Prediabetes: Secondary | ICD-10-CM

## 2014-12-10 DIAGNOSIS — Z79899 Other long term (current) drug therapy: Secondary | ICD-10-CM

## 2014-12-10 DIAGNOSIS — E039 Hypothyroidism, unspecified: Secondary | ICD-10-CM

## 2014-12-10 DIAGNOSIS — E663 Overweight: Secondary | ICD-10-CM

## 2014-12-10 LAB — HEPATIC FUNCTION PANEL
ALK PHOS: 90 U/L (ref 39–117)
ALT: 41 U/L (ref 0–53)
AST: 25 U/L (ref 0–37)
Albumin: 4.3 g/dL (ref 3.5–5.2)
Bilirubin, Direct: 0.2 mg/dL (ref 0.0–0.3)
Indirect Bilirubin: 0.7 mg/dL (ref 0.2–1.2)
Total Bilirubin: 0.9 mg/dL (ref 0.2–1.2)
Total Protein: 7 g/dL (ref 6.0–8.3)

## 2014-12-10 LAB — BASIC METABOLIC PANEL WITH GFR
BUN: 12 mg/dL (ref 6–23)
CALCIUM: 10 mg/dL (ref 8.4–10.5)
CO2: 27 meq/L (ref 19–32)
CREATININE: 1.05 mg/dL (ref 0.50–1.35)
Chloride: 104 mEq/L (ref 96–112)
GFR, EST NON AFRICAN AMERICAN: 76 mL/min
GFR, Est African American: 88 mL/min
Glucose, Bld: 96 mg/dL (ref 70–99)
Potassium: 4.3 mEq/L (ref 3.5–5.3)
Sodium: 143 mEq/L (ref 135–145)

## 2014-12-10 LAB — CBC WITH DIFFERENTIAL/PLATELET
Basophils Absolute: 0.1 10*3/uL (ref 0.0–0.1)
Basophils Relative: 1 % (ref 0–1)
Eosinophils Absolute: 0.3 10*3/uL (ref 0.0–0.7)
Eosinophils Relative: 4 % (ref 0–5)
HEMATOCRIT: 49 % (ref 39.0–52.0)
Hemoglobin: 17.2 g/dL — ABNORMAL HIGH (ref 13.0–17.0)
LYMPHS ABS: 1.3 10*3/uL (ref 0.7–4.0)
LYMPHS PCT: 19 % (ref 12–46)
MCH: 29.4 pg (ref 26.0–34.0)
MCHC: 35.1 g/dL (ref 30.0–36.0)
MCV: 83.8 fL (ref 78.0–100.0)
MONOS PCT: 8 % (ref 3–12)
MPV: 10 fL (ref 8.6–12.4)
Monocytes Absolute: 0.6 10*3/uL (ref 0.1–1.0)
NEUTROS ABS: 4.8 10*3/uL (ref 1.7–7.7)
NEUTROS PCT: 68 % (ref 43–77)
Platelets: 184 10*3/uL (ref 150–400)
RBC: 5.85 MIL/uL — AB (ref 4.22–5.81)
RDW: 14.3 % (ref 11.5–15.5)
WBC: 7.1 10*3/uL (ref 4.0–10.5)

## 2014-12-10 LAB — LIPID PANEL
CHOLESTEROL: 149 mg/dL (ref 0–200)
HDL: 38 mg/dL — AB (ref >=40)
LDL CALC: 87 mg/dL (ref 0–99)
TRIGLYCERIDES: 118 mg/dL (ref <150)
Total CHOL/HDL Ratio: 3.9 Ratio
VLDL: 24 mg/dL (ref 0–40)

## 2014-12-10 LAB — MAGNESIUM: MAGNESIUM: 1.9 mg/dL (ref 1.5–2.5)

## 2014-12-10 LAB — TSH: TSH: 2.361 u[IU]/mL (ref 0.350–4.500)

## 2014-12-10 LAB — HEMOGLOBIN A1C
Hgb A1c MFr Bld: 5.5 % (ref <5.7)
Mean Plasma Glucose: 111 mg/dL (ref <117)

## 2014-12-10 MED ORDER — RANITIDINE HCL 150 MG PO TABS: 150.0000 mg | ORAL_TABLET | Freq: Two times a day (BID) | ORAL | Status: DC

## 2014-12-10 MED ORDER — DOXYCYCLINE HYCLATE 100 MG PO CAPS: 100.0000 mg | ORAL_CAPSULE | Freq: Two times a day (BID) | ORAL | Status: AC

## 2014-12-10 MED ORDER — ALLOPURINOL 300 MG PO TABS: 300.0000 mg | ORAL_TABLET | Freq: Every day | ORAL | Status: AC

## 2014-12-10 NOTE — Progress Notes (Signed)
Patient ID: James Contreras, male   DOB: [birth year], 59 y.o.   MRN: [id number]  Assessment and Plan:  Hypertension:  -keep BP log for 2 weeks and see back if remaining elevated will start another medication. -Continue medication,  -monitor blood pressure at home.  -Continue DASH diet.   -Reminder to go to the ER if any CP, SOB, nausea, dizziness, severe HA, changes vision/speech, left arm numbness and tingling, and jaw pain.  Cholesterol: -Continue diet and exercise.  -Check cholesterol.   Pre-diabetes: -Continue diet and exercise.  -Check A1C  Vitamin D Def: -check level -continue medications.   Tick Bite -doxycycline  Continue diet and meds as discussed. Further disposition pending results of labs.  HPI 59 y.o. male  presents for 3 month follow up with hypertension, hyperlipidemia, prediabetes and vitamin D.   His blood pressure has been controlled at home, today their BP is BP: (!) 164/96 mmHg.   He does not workout.  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 He denies chest pain, shortness of breath, dizziness.   He is on cholesterol medication and denies myalgias. His cholesterol is at goal. The cholesterol last visit was:   Lab Results  Component Value Date   CHOL 125 08/31/2014   HDL 26* 08/31/2014   LDLCALC 70 08/31/2014   TRIG 147 08/31/2014   CHOLHDL 4.8 08/31/2014     He has been working on diet and exercise for prediabetes, and denies foot ulcerations, hyperglycemia, hypoglycemia , increased appetite, nausea, paresthesia of the feet, polydipsia, polyuria, visual disturbances, vomiting and weight loss. Last A1C in the office was:  Lab Results  Component Value Date   HGBA1C 5.4 08/31/2014    Patient is on Vitamin D supplement.  Lab Results  Component Value Date   VD25OH 61 08/31/2014     Patient admits to a small black tick bite on the left shoulder approximately 1 week ago.  He feels like it was a small black bug and has left a red  mark.  He reports that the was mildly fatigued and did have some left shoulder pain.  NO rash.    Current Medications:  Current Outpatient Prescriptions on File Prior to Visit  Medication Sig Dispense Refill  . allopurinol (ZYLOPRIM) 300 MG tablet TAKE ONE TABLET BY MOUTH ONCE DAILY 90 tablet 0  . atorvastatin (LIPITOR) 80 MG tablet TAKE ONE TABLET BY MOUTH ONCE DAILY 30 tablet 5  . Cholecalciferol (VITAMIN D PO) Take 4,000 Int'l Units by mouth.    . diltiazem (DILACOR XR) 240 MG 24 hr capsule Take 1 capsule (240 mg total) by mouth daily. 30 capsule 3  . levothyroxine (SYNTHROID, LEVOTHROID) 75 MCG tablet TAKE ONE TABLET BY MOUTH ONCE DAILY BEFROE BREAKFAST. 90 tablet 0  . testosterone cypionate (DEPOTESTOTERONE CYPIONATE) 200 MG/ML injection Use 1&1/2  To 2 ml intramuscular every 2 to 3 weeks as directed 10 mL 5   No current facility-administered medications on file prior to visit.    Medical History:  Past Medical History  Diagnosis Date  . Gout   . Vitamin D deficiency   . Other testicular hypofunction   . Unspecified hypothyroidism 2012  . Hypertension 1995  . Hyperlipidemia 2010    Allergies: No Known Allergies   Review of Systems:  Review of Systems  Constitutional: Negative for fever, chills and malaise/fatigue.  HENT: Negative for congestion, ear pain and sore throat.   Eyes: Negative.   Respiratory: Negative  for cough, shortness of breath and wheezing.   Cardiovascular: Negative for chest pain, palpitations and leg swelling.  Gastrointestinal: Positive for heartburn. Negative for nausea, vomiting, diarrhea, constipation, blood in stool and melena.  Genitourinary: Negative for dysuria, urgency, frequency and hematuria.  Skin: Negative.   Neurological: Negative for dizziness, sensory change, loss of consciousness and headaches.  Psychiatric/Behavioral: Negative for depression. The patient is not nervous/anxious and does not have insomnia.     Family history- Review  and unchanged  Social history- Review and unchanged  Physical Exam: BP 164/96 mmHg  Pulse 90  Temp(Src) 98.4 F (36.9 C) (Temporal)  Resp 18  Ht 6' (1.829 m)  Wt 202 lb (91.627 kg)  BMI 27.39 kg/m2 Wt Readings from Last 3 Encounters:  12/10/14 202 lb (91.627 kg)  08/31/14 199 lb 12.8 oz (90.629 kg)  03/31/14 193 lb 12.8 oz (87.907 kg)    General Appearance: Well nourished well developed, in no apparent distress. Eyes: PERRLA, EOMs, conjunctiva no swelling or erythema ENT/Mouth: Ear canals normal without obstruction, swelling, erythma, discharge.  TMs normal bilaterally.  Oropharynx moist, clear, without exudate, or postoropharyngeal swelling. Neck: Supple, thyroid normal,no cervical adenopathy  Respiratory: Respiratory effort normal, Breath sounds clear A&P without rhonchi, wheeze, or rale.  No retractions, no accessory usage. Cardio: RRR with no MRGs. Brisk peripheral pulses without edema.  Abdomen: Soft, + BS,  Non tender, no guarding, rebound, hernias, masses. Musculoskeletal: Full ROM, 5/5 strength, Normal gait Skin: Warm, dry without rashes, lesions, ecchymosis. Skin tag on the right eyelash line.   Neuro: Awake and oriented X 3, Cranial nerves intact. Normal muscle tone, no cerebellar symptoms. Psych: Normal affect, Insight and Judgment appropriate.    FORCUCCI, Megean Fabio, PA-C 8:53 AM [facility]

## 2014-12-10 NOTE — Patient Instructions (Signed)
Tick Bite Information Ticks are insects that attach themselves to the skin and draw blood for food. There are various types of ticks. Common types include wood ticks and deer ticks. Most ticks live in shrubs and grassy areas. Ticks can climb onto your body when you make contact with leaves or grass where the tick is waiting. The most common places on the body for ticks to attach themselves are the scalp, neck, armpits, waist, and groin. Most tick bites are harmless, but sometimes ticks carry germs that cause diseases. These germs can be spread to a person during the tick's feeding process. The chance of a disease spreading through a tick bite depends on:   The type of tick.  Time of year.   How long the tick is attached.   Geographic location.  HOW CAN YOU PREVENT TICK BITES? Take these steps to help prevent tick bites when you are outdoors:  Wear protective clothing. Long sleeves and long pants are best.   Wear white clothes so you can see ticks more easily.  Tuck your pant legs into your socks.   If walking on a trail, stay in the middle of the trail to avoid brushing against bushes.  Avoid walking through areas with long grass.  Put insect repellent on all exposed skin and along boot tops, pant legs, and sleeve cuffs.   Check clothing, hair, and skin repeatedly and before going inside.   Brush off any ticks that are not attached.  Take a shower or bath as soon as possible after being outdoors.  WHAT IS THE PROPER WAY TO REMOVE A TICK? Ticks should be removed as soon as possible to help prevent diseases caused by tick bites. 1. If latex gloves are available, put them on before trying to remove a tick.  2. Using fine-point tweezers, grasp the tick as close to the skin as possible. You may also use curved forceps or a tick removal tool. Grasp the tick as close to its head as possible. Avoid grasping the tick on its body. 3. Pull gently with steady upward pressure until  the tick lets go. Do not twist the tick or jerk it suddenly. This may break off the tick's head or mouth parts. 4. Do not squeeze or crush the tick's body. This could force disease-carrying fluids from the tick into your body.  5. After the tick is removed, wash the bite area and your hands with soap and water or other disinfectant such as alcohol. 6. Apply a small amount of antiseptic cream or ointment to the bite site.  7. Wash and disinfect any instruments that were used.  Do not try to remove a tick by applying a hot match, petroleum jelly, or fingernail polish to the tick. These methods do not work and may increase the chances of disease being spread from the tick bite.  WHEN SHOULD YOU SEEK MEDICAL CARE? Contact your health care provider if you are unable to remove a tick from your skin or if a part of the tick breaks off and is stuck in the skin.  After a tick bite, you need to be aware of signs and symptoms that could be related to diseases spread by ticks. Contact your health care provider if you develop any of the following in the days or weeks after the tick bite:  Unexplained fever.  Rash. A circular rash that appears days or weeks after the tick bite may indicate the possibility of Lyme disease. The rash may resemble   a target with a bull's-eye and may occur at a different part of your body than the tick bite.  Redness and swelling in the area of the tick bite.   Tender, swollen lymph glands.   Diarrhea.   Weight loss.   Cough.   Fatigue.   Muscle, joint, or bone pain.   Abdominal pain.   Headache.   Lethargy or a change in your level of consciousness.  Difficulty walking or moving your legs.   Numbness in the legs.   Paralysis.  Shortness of breath.   Confusion.   Repeated vomiting.  Document Released: 06/01/2000 Document Revised: 03/25/2013 Document Reviewed: 11/12/2012 [employer] Patient Information 2015 [employer]. This information is  not intended to replace advice given to you by your health care provider. Make sure you discuss any questions you have with your health care provider.  

## 2014-12-11 LAB — INSULIN, RANDOM: Insulin: 15.2 u[IU]/mL (ref 2.0–19.6)

## 2014-12-11 LAB — VITAMIN D 25 HYDROXY (VIT D DEFICIENCY, FRACTURES): Vit D, 25-Hydroxy: 67 ng/mL (ref 30–100)

## 2014-12-24 ENCOUNTER — Ambulatory Visit: Payer: Self-pay | Admitting: Internal Medicine

## 2015-02-02 ENCOUNTER — Other Ambulatory Visit: Payer: Self-pay | Admitting: Internal Medicine

## 2015-03-08 ENCOUNTER — Other Ambulatory Visit: Payer: Self-pay | Admitting: Physician Assistant

## 2015-03-25 ENCOUNTER — Ambulatory Visit: Payer: Self-pay | Admitting: Internal Medicine

## 2015-04-07 ENCOUNTER — Other Ambulatory Visit: Payer: Self-pay | Admitting: Internal Medicine

## 2015-05-15 ENCOUNTER — Other Ambulatory Visit: Payer: Self-pay | Admitting: Internal Medicine

## 2015-06-17 ENCOUNTER — Other Ambulatory Visit: Payer: Self-pay | Admitting: Internal Medicine

## 2015-09-19 ENCOUNTER — Encounter: Payer: Self-pay | Admitting: Internal Medicine

## 2015-10-07 ENCOUNTER — Other Ambulatory Visit: Payer: Self-pay | Admitting: Internal Medicine

## 2016-01-30 DIAGNOSIS — Z0000 Encounter for general adult medical examination without abnormal findings: Secondary | ICD-10-CM

## 2021-12-06 ENCOUNTER — Other Ambulatory Visit: Payer: Self-pay | Admitting: Nurse Practitioner

## 2021-12-06 DIAGNOSIS — M542 Cervicalgia: Secondary | ICD-10-CM
